# Patient Record
Sex: Male | Born: 1990 | Race: Black or African American | Hispanic: No | Marital: Single | Smoking: Current every day smoker
Health system: Southern US, Community
[De-identification: ages and names within clinical notes are randomized; demographics above are authoritative.]

## PROBLEM LIST (undated history)

## (undated) DIAGNOSIS — S62309A Unspecified fracture of unspecified metacarpal bone, initial encounter for closed fracture: Secondary | ICD-10-CM

## (undated) DIAGNOSIS — K529 Noninfective gastroenteritis and colitis, unspecified: Secondary | ICD-10-CM

## (undated) HISTORY — PX: NASAL FRACTURE SURGERY: SHX718

---
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
## 2011-04-18 ENCOUNTER — Emergency Department (HOSPITAL_BASED_OUTPATIENT_CLINIC_OR_DEPARTMENT_OTHER)
Admission: EM | Admit: 2011-04-18 | Discharge: 2011-04-19 | Disposition: A | Discharge status: Home / Self Care | Payer: Self-pay | Attending: Emergency Medicine | Admitting: Emergency Medicine

## 2011-04-18 ENCOUNTER — Emergency Department (INDEPENDENT_AMBULATORY_CARE_PROVIDER_SITE_OTHER): Payer: Self-pay

## 2011-04-18 ENCOUNTER — Encounter: Payer: Self-pay | Admitting: *Deleted

## 2011-04-18 DIAGNOSIS — S62309A Unspecified fracture of unspecified metacarpal bone, initial encounter for closed fracture: Secondary | ICD-10-CM | POA: Insufficient documentation

## 2011-04-18 DIAGNOSIS — X838XXA Intentional self-harm by other specified means, initial encounter: Secondary | ICD-10-CM | POA: Insufficient documentation

## 2011-04-18 DIAGNOSIS — Y9367 Activity, basketball: Secondary | ICD-10-CM

## 2011-04-18 DIAGNOSIS — S62329A Displaced fracture of shaft of unspecified metacarpal bone, initial encounter for closed fracture: Secondary | ICD-10-CM

## 2011-04-18 DIAGNOSIS — W219XXA Striking against or struck by unspecified sports equipment, initial encounter: Secondary | ICD-10-CM

## 2011-04-18 MED ORDER — HYDROCODONE-ACETAMINOPHEN 5-500 MG PO TABS: 1.0000 | ORAL_TABLET | Freq: Four times a day (QID) | ORAL | Status: AC | PRN

## 2011-04-18 NOTE — ED Provider Notes (Signed)
History     Chief Complaint  Patient presents with  . Hand Injury   Patient is a 20 y.o. male presenting with arm injury. The history is provided by the patient.  Arm Injury  The incident occurred today. The wounds were self-inflicted. There is an injury to the right hand. The pain is moderate. Associated symptoms include numbness and focal weakness. Pertinent negatives include no chest pain, no tingling and no cough. There have been no prior injuries to these areas. He is right-handed.  patient punched a pole while playing basketball and has pain and swelling in his right hand.   He also states that he has a red area on his left upper chest that has been there for a while.  History reviewed. No pertinent past medical history.  History reviewed. No pertinent past surgical history.  No family history on file.  History  Substance Use Topics  . Smoking status: Never Smoker   . Smokeless tobacco: Not on file  . Alcohol Use: No      Review of Systems  Constitutional: Negative for appetite change.  Respiratory: Negative for cough.   Cardiovascular: Negative for chest pain.  Gastrointestinal: Negative for abdominal distention.  Musculoskeletal:       Pain and swelling to right hand.   Skin: Positive for rash.  Neurological: Positive for focal weakness and numbness. Negative for tingling.    Physical Exam  BP 124/75  Pulse 48  Temp(Src) 98 F (36.7 C) (Oral)  Resp 19  Ht 5\' 5"  (1.651 m)  Wt 175 lb (79.379 kg)  BMI 29.12 kg/m2  SpO2 100%  Physical Exam  Constitutional: He appears well-developed.  HENT:  Head: Normocephalic.  Cardiovascular: Normal rate.   Pulmonary/Chest: Effort normal and breath sounds normal.       2cm round slightly raised redness to right   Abdominal: Soft.  Musculoskeletal: Normal range of motion.       Swelling and tenderness over right 4th and 5th metacarpal. Abrasion over knuckle, but skin intact.     ED Course  Procedures  MDM Boxers  fracture. Skin closed. Splinted. Hand followup.       Juliet Rude. Rubin Payor, MD 04/18/11 [phone]

## 2012-08-22 ENCOUNTER — Encounter (HOSPITAL_BASED_OUTPATIENT_CLINIC_OR_DEPARTMENT_OTHER): Payer: Self-pay | Admitting: *Deleted

## 2012-08-22 ENCOUNTER — Emergency Department (HOSPITAL_BASED_OUTPATIENT_CLINIC_OR_DEPARTMENT_OTHER): Payer: Self-pay

## 2012-08-22 ENCOUNTER — Emergency Department (HOSPITAL_BASED_OUTPATIENT_CLINIC_OR_DEPARTMENT_OTHER)
Admission: EM | Admit: 2012-08-22 | Discharge: 2012-08-22 | Disposition: A | Discharge status: Home / Self Care | Payer: Self-pay | Attending: Emergency Medicine | Admitting: Emergency Medicine

## 2012-08-22 DIAGNOSIS — K529 Noninfective gastroenteritis and colitis, unspecified: Secondary | ICD-10-CM

## 2012-08-22 DIAGNOSIS — K5289 Other specified noninfective gastroenteritis and colitis: Secondary | ICD-10-CM | POA: Insufficient documentation

## 2012-08-22 LAB — URINALYSIS, ROUTINE W REFLEX MICROSCOPIC
Bilirubin Urine: NEGATIVE
Specific Gravity, Urine: 1.035 — ABNORMAL HIGH (ref 1.005–1.030)
Urobilinogen, UA: 0.2 mg/dL (ref 0.0–1.0)
pH: 6 (ref 5.0–8.0)

## 2012-08-22 LAB — COMPREHENSIVE METABOLIC PANEL
ALT: 9 U/L (ref 0–53)
Alkaline Phosphatase: 73 U/L (ref 39–117)
BUN: 11 mg/dL (ref 6–23)
CO2: 26 mEq/L (ref 19–32)
GFR calc Af Amer: >90 mL/min (ref >90)
GFR calc non Af Amer: 85 mL/min — ABNORMAL LOW (ref >90)
Glucose, Bld: 101 mg/dL — ABNORMAL HIGH (ref 70–99)
Potassium: 3.4 mEq/L — ABNORMAL LOW (ref 3.5–5.1)
Sodium: 134 mEq/L — ABNORMAL LOW (ref 135–145)
Total Bilirubin: 1.4 mg/dL — ABNORMAL HIGH (ref 0.3–1.2)

## 2012-08-22 LAB — CBC WITH DIFFERENTIAL/PLATELET
Hemoglobin: 11.4 g/dL — ABNORMAL LOW (ref 13.0–17.0)
Lymphocytes Relative: 7 % — ABNORMAL LOW (ref 12–46)
Lymphs Abs: 0.8 10*3/uL (ref 0.7–4.0)
MCV: 88.7 fL (ref 78.0–100.0)
Monocytes Relative: 6 % (ref 3–12)
Neutrophils Relative %: 87 % — ABNORMAL HIGH (ref 43–77)
Platelets: 169 10*3/uL (ref 150–400)
RBC: 3.91 MIL/uL — ABNORMAL LOW (ref 4.22–5.81)
WBC: 11 10*3/uL — ABNORMAL HIGH (ref 4.0–10.5)

## 2012-08-22 MED ORDER — SODIUM CHLORIDE 0.9 % IV BOLUS (SEPSIS)
1000.0000 mL | Freq: Once | INTRAVENOUS | Status: AC
  Administered 2012-08-22: 1000 mL via INTRAVENOUS

## 2012-08-22 MED ORDER — METRONIDAZOLE 500 MG PO TABS: 500.0000 mg | ORAL_TABLET | Freq: Two times a day (BID) | ORAL | Status: DC

## 2012-08-22 MED ORDER — CIPROFLOXACIN HCL 500 MG PO TABS: 500.0000 mg | ORAL_TABLET | Freq: Two times a day (BID) | ORAL | Status: DC

## 2012-08-22 MED ORDER — IOHEXOL 300 MG/ML  SOLN
50.0000 mL | Freq: Once | INTRAMUSCULAR | Status: AC | PRN
  Administered 2012-08-22: 50 mL via ORAL

## 2012-08-22 MED ORDER — ONDANSETRON 8 MG PO TBDP: 8.0000 mg | ORAL_TABLET | Freq: Three times a day (TID) | ORAL | Status: DC | PRN

## 2012-08-22 MED ORDER — OXYCODONE-ACETAMINOPHEN 5-325 MG PO TABS: 1.0000 | ORAL_TABLET | ORAL | Status: DC | PRN

## 2012-08-22 MED ORDER — ONDANSETRON HCL 4 MG/2ML IJ SOLN
4.0000 mg | Freq: Once | INTRAMUSCULAR | Status: AC
  Administered 2012-08-22: 4 mg via INTRAVENOUS
  Filled 2012-08-22: qty 2

## 2012-08-22 MED ORDER — IOHEXOL 300 MG/ML  SOLN
100.0000 mL | Freq: Once | INTRAMUSCULAR | Status: AC | PRN
  Administered 2012-08-22: 100 mL via INTRAVENOUS

## 2012-08-22 MED ORDER — CIPROFLOXACIN IN D5W 400 MG/200ML IV SOLN
400.0000 mg | Freq: Once | INTRAVENOUS | Status: AC
  Administered 2012-08-22: 400 mg via INTRAVENOUS
  Filled 2012-08-22: qty 200

## 2012-08-22 MED ORDER — METRONIDAZOLE 500 MG PO TABS
500.0000 mg | ORAL_TABLET | Freq: Once | ORAL | Status: AC
  Administered 2012-08-22: 500 mg via ORAL
  Filled 2012-08-22: qty 1

## 2012-08-22 MED ORDER — HYDROMORPHONE HCL PF 1 MG/ML IJ SOLN
1.0000 mg | Freq: Once | INTRAMUSCULAR | Status: AC
  Administered 2012-08-22: 1 mg via INTRAVENOUS
  Filled 2012-08-22: qty 1

## 2012-08-22 NOTE — ED Notes (Signed)
Patient called for triage; patient not in waiting room.  Reported that he is the bathroom.

## 2012-08-22 NOTE — ED Provider Notes (Signed)
History     CSN: [id number]  Arrival date & time 08/22/12  1204   First MD Initiated Contact with Patient 08/22/12 1238      Chief Complaint  Patient presents with  . Abdominal Pain    (Consider location/radiation/quality/duration/timing/severity/associated sxs/prior treatment) HPI Patient complaining of diffuse abdominal pain since yesterday. He has had some diarrhea but not very much. He has had crampy diffuse pain. He has had nausea but no vomiting. He has had some fever and chills. He denies any previous similar symptoms. The stool is without blood or pus. He has been urinating the same as usual without any burning with urination or urethral discharge or testicular pain. History reviewed. No pertinent past medical history.  History reviewed. No pertinent past surgical history.  No family history on file.  History  Substance Use Topics  . Smoking status: Never Smoker   . Smokeless tobacco: Not on file  . Alcohol Use: No      Review of Systems  Constitutional: Negative for fever and chills.  HENT: Negative for neck stiffness.   Eyes: Negative for visual disturbance.  Respiratory: Negative for shortness of breath.   Cardiovascular: Negative for chest pain.  Gastrointestinal: Negative for vomiting, diarrhea and blood in stool.  Genitourinary: Negative for dysuria, frequency and decreased urine volume.  Musculoskeletal: Negative for myalgias and joint swelling.  Skin: Negative for rash.  Neurological: Negative for weakness.  Hematological: Negative for adenopathy.  Psychiatric/Behavioral: Negative for agitation.    Allergies  Bee venom and Shellfish allergy  Home Medications  No current outpatient prescriptions on file.  BP 123/72  Pulse 75  Temp 100.4 F (38 C) (Oral)  Resp 18  Ht 5\' 9"  (1.753 m)  Wt 160 lb (72.576 kg)  BMI 23.63 kg/m2  SpO2 99%  Physical Exam  Nursing note and vitals reviewed. Constitutional: He is oriented to person, place, and  time. He appears well-developed and well-nourished.  HENT:  Head: Normocephalic and atraumatic.  Eyes: Conjunctivae normal and EOM are normal. Pupils are equal, round, and reactive to light.  Neck: Normal range of motion. Neck supple.  Cardiovascular: Normal rate and regular rhythm.   Pulmonary/Chest: Effort normal.  Abdominal: Soft. Bowel sounds are normal. There is tenderness.       Mild diffuse tenderness no rebound noted.  Musculoskeletal: Normal range of motion.  Neurological: He is alert and oriented to person, place, and time.  Skin: Skin is warm and dry.  Psychiatric: He has a normal mood and affect. His behavior is normal. Judgment and thought content normal.    ED Course  Procedures (including critical care time)  Labs Reviewed  URINALYSIS, ROUTINE W REFLEX MICROSCOPIC - Abnormal; Notable for the following:    Color, Urine AMBER (*)  BIOCHEMICALS MAY BE AFFECTED BY COLOR   Specific Gravity, Urine 1.035 (*)     Hgb urine dipstick TRACE (*)     Ketones, ur 15 (*)     Protein, ur 100 (*)     All other components within normal limits  CBC WITH DIFFERENTIAL - Abnormal; Notable for the following:    WBC 11.0 (*)     RBC 3.91 (*)     Hemoglobin 11.4 (*)     HCT 34.7 (*)     Neutrophils Relative 87 (*)     Neutro Abs 9.6 (*)     Lymphocytes Relative 7 (*)     All other components within normal limits  COMPREHENSIVE METABOLIC PANEL - Abnormal; Notable  for the following:    Sodium 134 (*)     Potassium 3.4 (*)     Glucose, Bld 101 (*)     Total Bilirubin 1.4 (*)     GFR calc non Af Amer 85 (*)     All other components within normal limits  LIPASE, BLOOD  URINE MICROSCOPIC-ADD ON   Ct Abdomen Pelvis W Contrast  08/22/2012  *RADIOLOGY REPORT*  Clinical Data: Abdominal pain and diarrhea.  CT ABDOMEN AND PELVIS WITH CONTRAST  Technique:  Multidetector CT imaging of the abdomen and pelvis was performed following the standard protocol during bolus administration of  intravenous contrast.  Contrast:  OMNIPAQUE IOHEXOL 300 MG/ML  SOLN  Comparison: None.  Findings: The abdominal parenchymal organs are normal in appearance.  Gallbladder is unremarkable.  No evidence of hydronephrosis.  No soft tissue masses or lymphadenopathy identified within the abdomen or pelvis.  The diffuse colonic wall thickening is seen throughout the colon. There appears to be sparing of the rectum and no definite involvement of the terminal ileum.  There is no evidence of bowel obstruction, abscess, or free fluid.  This is consistent with diffuse colitis peri  IMPRESSION:  1.  Findings consistent with diffuse colitis, most likely infectious or inflammatory in etiology. 2.  No evidence of abscess or other complication.   Original Report Authenticated By: Myles Rosenthal, M.D.      No diagnosis found.    Patient with fever and colitis on CT scan. He is given IV Cipro and by mouth Flagyl here care. He is tolerating fluids without difficulty. He is discharged on Cipro, Flagyl, Percocet, and Zofran. He is advised to return if he is not better in 24 hours or if he is worsening at any time       Hilario Quarry, MD 08/22/12 1450

## 2012-08-22 NOTE — ED Notes (Signed)
Abdominal pain since yesterday. Diarrhea. No appetite.

## 2012-08-22 NOTE — ED Notes (Signed)
Diarrhea no appetite.

## 2013-05-02 ENCOUNTER — Emergency Department (HOSPITAL_COMMUNITY)
Admission: EM | Admit: 2013-05-02 | Discharge: 2013-05-02 | Disposition: A | Discharge status: Home / Self Care | Payer: Self-pay | Attending: Emergency Medicine | Admitting: Emergency Medicine

## 2013-05-02 ENCOUNTER — Emergency Department (HOSPITAL_COMMUNITY): Payer: Self-pay

## 2013-05-02 ENCOUNTER — Encounter (HOSPITAL_COMMUNITY): Payer: Self-pay | Admitting: Emergency Medicine

## 2013-05-02 DIAGNOSIS — K1121 Acute sialoadenitis: Secondary | ICD-10-CM

## 2013-05-02 DIAGNOSIS — K1120 Sialoadenitis, unspecified: Secondary | ICD-10-CM | POA: Insufficient documentation

## 2013-05-02 DIAGNOSIS — Z79899 Other long term (current) drug therapy: Secondary | ICD-10-CM | POA: Insufficient documentation

## 2013-05-02 DIAGNOSIS — Z8719 Personal history of other diseases of the digestive system: Secondary | ICD-10-CM | POA: Insufficient documentation

## 2013-05-02 DIAGNOSIS — Z8781 Personal history of (healed) traumatic fracture: Secondary | ICD-10-CM | POA: Insufficient documentation

## 2013-05-02 HISTORY — DX: Noninfective gastroenteritis and colitis, unspecified: K52.9

## 2013-05-02 HISTORY — DX: Unspecified fracture of unspecified metacarpal bone, initial encounter for closed fracture: S62.309A

## 2013-05-02 MED ORDER — AMOXICILLIN-POT CLAVULANATE 875-125 MG PO TABS: 1.0000 | ORAL_TABLET | Freq: Two times a day (BID) | ORAL | Status: DC

## 2013-05-02 MED ORDER — FENTANYL CITRATE 0.05 MG/ML IJ SOLN
100.0000 ug | Freq: Once | INTRAMUSCULAR | Status: AC
  Administered 2013-05-02: 100 ug via INTRAVENOUS
  Filled 2013-05-02: qty 2

## 2013-05-02 MED ORDER — HYDROCODONE-ACETAMINOPHEN 5-325 MG PO TABS: 1.0000 | ORAL_TABLET | Freq: Four times a day (QID) | ORAL | Status: DC | PRN

## 2013-05-02 MED ORDER — SODIUM CHLORIDE 0.9 % IV SOLN
INTRAVENOUS | Status: DC
  Administered 2013-05-02: 05:00:00 via INTRAVENOUS

## 2013-05-02 MED ORDER — SODIUM CHLORIDE 0.9 % IV SOLN
3.0000 g | Freq: Once | INTRAVENOUS | Status: AC
  Administered 2013-05-02: 3 g via INTRAVENOUS
  Filled 2013-05-02: qty 3

## 2013-05-02 MED ORDER — IOHEXOL 300 MG/ML  SOLN
75.0000 mL | Freq: Once | INTRAMUSCULAR | Status: AC | PRN
  Administered 2013-05-02: 75 mL via INTRAVENOUS

## 2013-05-02 NOTE — ED Provider Notes (Signed)
History    CSN: [id number] Arrival date & time 05/02/13  8295  First MD Initiated Contact with Patient 05/02/13 0424     Chief Complaint  Patient presents with  . Neck Pain   (Consider location/radiation/quality/duration/timing/severity/associated sxs/prior Treatment) HPI This is a 22 year old male with a two-day history of pain in his neck. Specifically the pain is located just inferior and posterior to the right external ear. The pain is moderate to severe, worse with palpation or movement of his head. He is not aware of having any fever. He has a toothache on that side. He has no associated lymphadenopathy. He denies any injury. He states the pain radiates into the right side of his face.  Past Medical History  Diagnosis Date  . Colitis   . Metacarpal bone fracture    History reviewed. No pertinent past surgical history. No family history on file. History  Substance Use Topics  . Smoking status: Never Smoker   . Smokeless tobacco: Not on file  . Alcohol Use: No    Review of Systems  All other systems reviewed and are negative.    Allergies  Bee venom and Shellfish allergy  Home Medications   Current Outpatient Rx  Name  Route  Sig  Dispense  Refill  . ciprofloxacin (CIPRO) 500 MG tablet   Oral   Take 1 tablet (500 mg total) by mouth every 12 (twelve) hours.   10 tablet   0   . metroNIDAZOLE (FLAGYL) 500 MG tablet   Oral   Take 1 tablet (500 mg total) by mouth 2 (two) times daily.   14 tablet   0   . ondansetron (ZOFRAN ODT) 8 MG disintegrating tablet   Oral   Take 1 tablet (8 mg total) by mouth every 8 (eight) hours as needed for nausea.   20 tablet   0   . oxyCODONE-acetaminophen (PERCOCET/ROXICET) 5-325 MG per tablet   Oral   Take 1 tablet by mouth every 4 (four) hours as needed for pain.   12 tablet   0    BP 122/54  Pulse 60  Temp(Src) 97.8 F (36.6 C) (Oral)  Resp 18  SpO2 100%  Physical Exam General: Well-developed, well-nourished  male in no acute distress; appearance consistent with age of record HENT: normocephalic, atraumatic; normal TMs Eyes: pupils equal round and reactive to light; extraocular muscles intact Neck: supple but decreased range of motion due to pain; tenderness and mild swelling of soft tissue of right neck inferior and just posterior to right external ear without palpable lymphadenopathy Heart: regular rate and rhythm Lungs: clear to auscultation bilaterally Abdomen: soft; nondistended Extremities: No deformity; full range of motion Neurologic: Awake, alert and oriented; motor function intact in all extremities and symmetric; no facial droop Skin: Warm and dry Psychiatric: Flat affect    ED Course  Procedures (including critical care time)   MDM  Nursing notes and vitals signs, including pulse oximetry, reviewed.  Summary of this visit's results, reviewed by myself:  Imaging Studies: Ct Soft Tissue Neck W Contrast  05/02/2013   *RADIOLOGY REPORT*  Clinical Data: Neck pain and swelling on the right.  CT NECK WITH CONTRAST  Technique:  Multidetector CT imaging of the neck was performed with intravenous contrast.  Contrast: 75mL OMNIPAQUE IOHEXOL 300 MG/ML  SOLN  Comparison: CT head 11/28/2004  Findings: Visualization of the area of the patient's pain is limited due to streak artifact arising from dental work.  There appears to be some  infiltration in the subcutaneous fat below the right ear which is somewhat asymmetric to the left side.  Changes could represent inflammation or infiltration in the parotid gland. No discrete fluid collection or focal mass lesion is identified. Cervical lymph nodes are not pathologically involved.  Sublingual glands appear mildly prominent.  Tonsils and adenoids appear mildly prominent.  No discrete tonsillar abscess identified.  The cervical carotid and jugular vessels appear patent without displacement. Fat planes are not effaced.  No prevertebral or mucosal space  mass or infiltration is identified.  No focal bone erosion.  Visualized paranasal sinuses are not opacified.  Probable old nasal bone fractures.  Lung apices are clear.  Cervical tracheal airway appears patent.  IMPRESSION: Visualization of the relevant anatomy is limited due to streak artifact from dental work.  There appears to be some fullness in the soft tissues anterior and beneath the right ear, corresponding to the palpable abnormality.  This appears represent enlargement or infiltration of the salivary glands and likely is inflammatory.  No discrete abscess or mass is identified.   Original Report Authenticated By: Burman Nieves, M.D.      Hanley Seamen, MD 05/02/13 [id number]

## 2013-05-02 NOTE — ED Notes (Signed)
The pt is c/o pain in his rt neck just under his rt ear.  He just noticed today

## 2013-05-02 NOTE — ED Notes (Addendum)
PT. TRANSPORTED TO CT . IV NS INFUSING , IV SITE UNREMARKABLE , PT. GIVEN FENTANYL 100 MCG .

## 2013-05-04 LAB — MUMPS ANTIBODY, IGM: Mumps IgM: <1:20 {titer}

## 2014-01-05 ENCOUNTER — Emergency Department (HOSPITAL_COMMUNITY)
Admission: EM | Admit: 2014-01-05 | Discharge: 2014-01-05 | Disposition: A | Discharge status: Home / Self Care | Payer: Self-pay | Attending: Emergency Medicine | Admitting: Emergency Medicine

## 2014-01-05 ENCOUNTER — Encounter (HOSPITAL_COMMUNITY): Payer: Self-pay | Admitting: Emergency Medicine

## 2014-01-05 DIAGNOSIS — K029 Dental caries, unspecified: Secondary | ICD-10-CM | POA: Insufficient documentation

## 2014-01-05 DIAGNOSIS — Z8781 Personal history of (healed) traumatic fracture: Secondary | ICD-10-CM | POA: Insufficient documentation

## 2014-01-05 MED ORDER — OXYCODONE-ACETAMINOPHEN 5-325 MG PO TABS
1.0000 | ORAL_TABLET | Freq: Once | ORAL | Status: AC
  Administered 2014-01-05: 1 via ORAL
  Filled 2014-01-05: qty 1

## 2014-01-05 MED ORDER — PENICILLIN V POTASSIUM 250 MG PO TABS: 250.0000 mg | ORAL_TABLET | Freq: Four times a day (QID) | ORAL | Status: AC

## 2014-01-05 MED ORDER — OXYCODONE-ACETAMINOPHEN 5-325 MG PO TABS: 1.0000 | ORAL_TABLET | Freq: Four times a day (QID) | ORAL | Status: AC | PRN

## 2014-01-05 NOTE — ED Notes (Signed)
Patient has ride home with girlfriend 

## 2014-01-05 NOTE — ED Provider Notes (Signed)
CSN: [id number]     Arrival date & time 01/05/14  0027 History   First MD Initiated Contact with Patient 01/05/14 0251     Chief Complaint  Patient presents with  . Dental Pain     (Consider location/radiation/quality/duration/timing/severity/associated sxs/prior Treatment) Patient is a 23 y.o. male presenting with tooth pain. The history is provided by the patient.  Dental Pain Location:  Upper Upper teeth location:  2/RU 2nd molar and 1/RU 3rd molar Quality:  Localized, sharp, radiating and pulsating Severity:  Severe Onset quality:  Gradual Duration:  3 days Timing:  Constant Progression:  Worsening Chronicity:  New Context: dental caries   Relieved by:  Nothing Worsened by:  Hot food/drink, cold food/drink and touching Ineffective treatments:  Acetaminophen, ice, NSAIDs and topical anesthetic gel Associated symptoms: facial pain   Associated symptoms: no difficulty swallowing, no drooling, no facial swelling, no fever, no gum swelling and no trismus   Risk factors: periodontal disease   Risk factors: no alcohol problem     Past Medical History  Diagnosis Date  . Colitis   . Metacarpal bone fracture    History reviewed. No pertinent past surgical history. No family history on file. History  Substance Use Topics  . Smoking status: Never Smoker   . Smokeless tobacco: Not on file  . Alcohol Use: No    Review of Systems  Constitutional: Negative for fever.  HENT: Negative for drooling and facial swelling.   All other systems reviewed and are negative.      Allergies  Bee venom and Shellfish allergy  Home Medications   Current Outpatient Rx  Name  Route  Sig  Dispense  Refill  . oxyCODONE-acetaminophen (PERCOCET/ROXICET) 5-325 MG per tablet   Oral   Take 1-2 tablets by mouth every 6 (six) hours as needed for severe pain.   15 tablet   0   . penicillin v potassium (VEETID) 250 MG tablet   Oral   Take 1 tablet (250 mg total) by mouth 4 (four) times  daily.   40 tablet   0    BP 138/76  Pulse 75  Temp(Src) 97.7 F (36.5 C) (Oral)  Resp 18  Ht 5\' 8"  (1.727 m)  Wt 161 lb (73.029 kg)  BMI 24.49 kg/m2  SpO2 100% Physical Exam  Nursing note and vitals reviewed. Constitutional: He appears well-developed and well-nourished. No distress.  HENT:  Head: Normocephalic and atraumatic.  Mouth/Throat: No oral lesions. No trismus in the jaw. Dental caries present. No dental abscesses or uvula swelling.    Eyes: EOM are normal. Pupils are equal, round, and reactive to light.  Cardiovascular: Normal rate.   Pulmonary/Chest: Effort normal.  Lymphadenopathy:    He has no cervical adenopathy.  Neurological: He is alert.  Skin: Skin is warm and dry. No rash noted. No erythema.  Psychiatric: He has a normal mood and affect. His behavior is normal.    ED Course  Procedures (including critical care time) Labs Review Labs Reviewed - No data to display Imaging Review No results found.   EKG Interpretation None      MDM   Final diagnoses:  Dental caries    Pt with dental caries and without facial swelling.  No signs of ludwig's angina or difficulty swallowing and no systemic symptoms. Will treat with PCN and have pt f/u with dentist.     Gwyneth SproutWhitney Jevaughn Degollado, MD 01/05/14 [id number]

## 2014-01-05 NOTE — Discharge Instructions (Signed)
Dental Caries °Dental caries is tooth decay. This decay can cause a hole in teeth (cavity) that can get bigger and deeper over time. °HOME CARE °· Brush and floss your teeth. Do this at least two times a day. °· Use a fluoride toothpaste. °· Use a mouth rinse if told by your dentist or doctor. °· Eat less sugary and starchy foods. Drink less sugary drinks. °· Avoid snacking often on sugary and starchy foods. Avoid sipping often on sugary drinks. °· Keep regular checkups and cleanings with your dentist. °· Use fluoride supplements if told by your dentist or doctor. °· Allow fluoride to be applied to teeth if told by your dentist or doctor. °MAKE SURE YOU: °· Understand these instructions. °· Will watch your condition. °· Will get help right away if you are not doing well or get worse. °Document Released: 07/07/2008 Document Revised: 05/31/2013 Document Reviewed: 09/30/2012 °[employer]® Patient Information ©2014 [employer]. ° °Dental Pain °A tooth ache may be caused by cavities (tooth decay). Cavities expose the nerve of the tooth to air and hot or cold temperatures. It may come from an infection or abscess (also called a boil or furuncle) around your tooth. It is also often caused by dental caries (tooth decay). This causes the pain you are having. °DIAGNOSIS  °Your caregiver can diagnose this problem by exam. °TREATMENT  °· If caused by an infection, it may be treated with medications which kill germs (antibiotics) and pain medications as prescribed by your caregiver. Take medications as directed. °· Only take over-the-counter or prescription medicines for pain, discomfort, or fever as directed by your caregiver. °· Whether the tooth ache today is caused by infection or dental disease, you should see your dentist as soon as possible for further care. °SEEK MEDICAL CARE IF: °The exam and treatment you received today has been provided on an emergency basis only. This is not a substitute for complete medical or dental  care. If your problem worsens or new problems (symptoms) appear, and you are unable to meet with your dentist, call or return to this location. °SEEK IMMEDIATE MEDICAL CARE IF:  °· You have a fever. °· You develop redness and swelling of your face, jaw, or neck. °· You are unable to open your mouth. °· You have severe pain uncontrolled by pain medicine. °MAKE SURE YOU:  °· Understand these instructions. °· Will watch your condition. °· Will get help right away if you are not doing well or get worse. °Document Released: 09/28/2005 Document Revised: 12/21/2011 Document Reviewed: 05/16/2008 °[employer]® Patient Information ©2014 [employer]. ° °

## 2014-01-05 NOTE — ED Notes (Signed)
Pt. reports right upper molar pain / cavity for several days worse today .

## 2014-10-31 ENCOUNTER — Emergency Department (HOSPITAL_COMMUNITY)
Admission: EM | Admit: 2014-10-31 | Discharge: 2014-10-31 | Disposition: A | Discharge status: Home / Self Care | Payer: Self-pay | Attending: Emergency Medicine | Admitting: Emergency Medicine

## 2014-10-31 ENCOUNTER — Encounter (HOSPITAL_COMMUNITY): Payer: Self-pay | Admitting: Emergency Medicine

## 2014-10-31 DIAGNOSIS — Z79891 Long term (current) use of opiate analgesic: Secondary | ICD-10-CM | POA: Insufficient documentation

## 2014-10-31 DIAGNOSIS — K029 Dental caries, unspecified: Secondary | ICD-10-CM | POA: Insufficient documentation

## 2014-10-31 DIAGNOSIS — Z8781 Personal history of (healed) traumatic fracture: Secondary | ICD-10-CM | POA: Insufficient documentation

## 2014-10-31 DIAGNOSIS — K0381 Cracked tooth: Secondary | ICD-10-CM | POA: Insufficient documentation

## 2014-10-31 MED ORDER — PENICILLIN V POTASSIUM 500 MG PO TABS: 500.0000 mg | ORAL_TABLET | Freq: Three times a day (TID) | ORAL | Status: AC

## 2014-10-31 MED ORDER — IBUPROFEN 600 MG PO TABS
600.0000 mg | ORAL_TABLET | Freq: Four times a day (QID) | ORAL | Status: AC | PRN
Start: 2014-10-31 — End: ?

## 2014-10-31 MED ORDER — HYDROCODONE-ACETAMINOPHEN 5-325 MG PO TABS: 1.0000 | ORAL_TABLET | ORAL | Status: AC | PRN

## 2014-10-31 NOTE — ED Notes (Signed)
Pt a/o x 4 on d/c with steady gait. 

## 2014-10-31 NOTE — ED Notes (Signed)
Pt c/o bottom right tooth pain x 3 weeks.

## 2014-10-31 NOTE — Discharge Instructions (Signed)
Dental Caries Dental caries is tooth decay. This decay can cause a hole in teeth (cavity) that can get bigger and deeper over time. HOME CARE  Brush and floss your teeth. Do this at least two times a day.  Use a fluoride toothpaste.  Use a mouth rinse if told by your dentist or doctor.  Eat less sugary and starchy foods. Drink less sugary drinks.  Avoid snacking often on sugary and starchy foods. Avoid sipping often on sugary drinks.  Keep regular checkups and cleanings with your dentist.  Use fluoride supplements if told by your dentist or doctor.  Allow fluoride to be applied to teeth if told by your dentist or doctor. Document Released: 07/07/2008 Document Revised: 02/12/2014 Document Reviewed: 09/30/2012 [facility] Patient Information 2015 [place], [place]. This information is not intended to replace advice given to you by your health care provider. Make sure you discuss any questions you have with your health care provider.  Dental Pain Toothache is pain in or around a tooth. It may get worse with chewing or with cold or heat.  HOME CARE  Your dentist may use a numbing medicine during treatment. If so, you may need to avoid eating until the medicine wears off. Ask your dentist about this.  Only take medicine as told by your dentist or doctor.  Avoid chewing food near the painful tooth until after all treatment is done. Ask your dentist about this. GET HELP RIGHT AWAY IF:   The problem gets worse or new problems appear.  You have a fever.  There is redness and puffiness (swelling) of the face, jaw, or neck.  You cannot open your mouth.  There is pain in the jaw.  There is very bad pain that is not helped by medicine. MAKE SURE YOU:   Understand these instructions.  Will watch your condition.  Will get help right away if you are not doing well or get worse. Document Released: 03/16/2008 Document Revised: 12/21/2011 Document Reviewed: 03/16/2008 [facility] Patient  Information 2015 [place], [place]. This information is not intended to replace advice given to you by your health care provider. Make sure you discuss any questions you have with your health care provider.   Emergency Department Resource Guide 1) Find a Doctor and Pay Out of Pocket Although you won't have to find out who is covered by your insurance plan, it is a good idea to ask around and get recommendations. You will then need to call the office and see if the doctor you have chosen will accept you as a new patient and what types of options they offer for patients who are self-pay. Some doctors offer discounts or will set up payment plans for their patients who do not have insurance, but you will need to ask so you aren't surprised when you get to your appointment.  2) Contact Your Local Health Department Not all health departments have doctors that can see patients for sick visits, but many do, so it is worth a call to see if yours does. If you don't know where your local health department is, you can check in your phone book. The CDC also has a tool to help you locate your state's health department, and many state websites also have listings of all of their local health departments.  3) Find a Walk-in Clinic If your illness is not likely to be very severe or complicated, you may want to try a walk in clinic. These are popping up all over the country in  pharmacies, drugstores, and shopping centers. They're usually staffed by nurse practitioners or physician assistants that have been trained to treat common illnesses and complaints. They're usually fairly quick and inexpensive. However, if you have serious medical issues or chronic medical problems, these are probably not your best option. ° °No Primary Care Doctor: °- Call Health Connect at  832-8000 - they can help you locate a primary care doctor that  accepts your insurance, provides certain services, etc. °- Physician Referral Service-  1-800-533-3463 ° °Chronic Pain Problems: °Organization         Address  Phone   Notes  °[facility]  ([phone]) [phone] Patients need to be referred by their primary care doctor.  ° °Medication Assistance: °Organization         Address  Phone   Notes  °[place] [facility] [address] °[place], [place] [address] (336) 641-8030 --Must be a resident of [place] °-- Must have NO insurance coverage whatsoever (no Medicaid/ Medicare, etc.) °-- The pt. MUST have a primary care doctor that directs their care regularly and follows them in the community °  °[facility]  (866) 331-1348   °United Way  (888) 892-1162   ° °Agencies that provide inexpensive medical care: °Organization         Address  Phone   Notes  °[facility]  (336) 832-8035   °[facility]    (336) 832-7272   °[facility] [address] °[place], [place] [address] (336) 832-4777   °[facility] [address], °[place] (336) 271-4999   °Planned Parenthood    (336) 373-0678   °[facility]    (336) 272-1050   °[facility] ° [address], [place] Phone:  (336) 832-4444, Fax:  (336) 832-4440 Hours of Operation:  9 am - 6 pm, M-F.  Also accepts Medicaid/Medicare and self-pay.  °[facility] ° [address], [place] Phone: (336) [phone], Fax: (336) [phone]. Hours of Operation:  8:30 am - 5:30 pm, M-F.  Also accepts Medicaid and self-pay.  °[facility] [place] [address], [place] Phone: (336) [phone]   °[facility] [address], [place], [place] (336)723-1848, Ext. 123 Mondays & Thursdays: 7-9 AM.  First 15 patients are seen on a first come, first serve basis. °  ° °Medicaid-accepting [place] Providers: ° °Organization         Address  Phone   Notes  °[facility] [address],  [place] ([phone]) [phone] Also accepts self-pay patients.  °[facility] [address], [place] ° (336) [phone]   °[facility] [address], [place] (336) [phone]   °[facility] [address] [place] [address], [place] (336) 299-7000   °Veita Bland [address], [place]  ° (336) [phone] Only accepts [place] Access Medicaid patients after they have their name applied to their card.  ° °Self-Pay (no insurance) in [place]: ° °Organization         Address  Phone   Notes  °Sickle Cell Patients, [facility] [address], [place] (336) 832-1970   °[facility] [address],  (336) [phone]   °[facility] ° [address],   [place] [phone]) [phone]   [facility]/Dr. Osei-Bonsu  [address] [place] [address], [place] or [address], [place] [phone] Phone number for both [place] and [place] locations is the same.  [facility] [address], [place] [phone]) [phone]   [facility] [address] [place] [address], [place] or [address] [phone](336) [phone](336) [phone]   [facility] [address], [place] [phone]) [phone], phone; [phone](336) [phone], fax Sees patients 1st and 3rd Saturday of every month.  Must not qualify for public or private insurance (i.e. Medicaid, Medicare, [place] Health Choice, Veterans' Benefits)  Household income should be no more than 200% of the poverty level The clinic cannot treat you if you are pregnant or think you are pregnant  Sexually transmitted diseases are not treated at the clinic.                   Dental Care:                              Organization         Address  Phone  Notes  [facility] [address], [place] [phone]) [phone] Accepts  children up to age 25 who are enrolled in [place] or [place] Health Choice; pregnant women with a Medicaid card; and children who have applied for Medicaid or [place] Health Choice, but were declined, whose parents can pay a reduced fee at time of service.  [facility] [place]  [address], [place] [phone]) [phone] Accepts children up to age 25 who are enrolled in [place] or [place] Health Choice; pregnant women with a Medicaid card; and children who have applied for Medicaid or [place] Health Choice, but were declined, whose parents can pay a reduced fee at time of service.  [facility]  [address], [place] [phone] Patients are seen by appointment only. Walk-ins are not accepted. [facility] will see patients 25 years of age and older. Monday - Tuesday (8am-5pm) Most Wednesdays (8:30-5pm) $30 per visit, cash only  [facility]  [address], [facility] [phone] Patients are seen by appointment only. Walk-ins are not accepted. [facility] will see patients 25 years of age and older. One Wednesday Evening (Monthly: Volunteer Based).  $30 per visit, cash only  [employer] [facility]  [phone] for adults; Children under age 714, call Graduate Pediatric Dentistry at [phone]. Children aged 874-14, please call [phone] to request a pediatric application.  Dental services are provided in all areas of dental care including fillings, crowns and bridges, complete and partial dentures, implants, gum treatment, root canals, and extractions. Preventive care is also provided. Treatment is provided to both adults and children. Patients are selected via a lottery and there is often a waiting list.   [facility] [address], [place]  [phone]) [phone] [email or web address]   [facility] [address], [place], [place] [phone], Ext. [phone] Second and  Fourth Thursday of each month, opens at 6:30 AM; Clinic ends at 9 AM.  Patients are seen on a first-come first-served basis, and a limited number are seen during each clinic.   [facility]  [address], [place], [place] [phone](336) [phone]  Eligibility Requirements You must have lived in [place], [place], or [place] counties for at least the last three months.   You cannot be eligible for state or federal sponsored [employer], including [employer], [place], or [employer].   You generally cannot be eligible for healthcare insurance through your employer.    How to apply: Eligibility screenings are held every Tuesday and Wednesday afternoon from 1:00 pm until 4:00 pm. You do not need an appointment for the interview!  [facility] [address], [place], [place]   [place]  [place] Department  [place]  [phone]    Behavioral Health Resources in the Community: Intensive Outpatient Programs Organization         Address  Phone  Notes  [place] Joseph. [address], [place], [place] [phone]   [facility] [address] [place], [place]   [facility] [address], [place], [place]   [place] [address],  [place], [place] or 801-331-1398   Substance Abuse Resources Organization         Address  Phone  Notes  [facility] Drug Services  229-430-3196   [place]  [phone]   [facility] [place]   Chinita Pester  (562)757-1752   Residential & Outpatient Substance Abuse Program  812-248-4363   Psychological Services Organization         Address  Phone  Notes  [facility] [place]  [place]  336-767-9850   [place]  [address] [place] or 317-841-7391    Mobile Crisis Teams Organization         Address  Phone  Notes  [facility], Mobile Crisis Care Unit  (731)[phone]   [facility]  [address] [place], [place]   [place] Levels [address] [place] [phone]    Self-Help/Support Groups Organization         Address  Phone             Notes  [place]. of [place] - variety of support groups  [place] Call for more information  Narcotics Anonymous (NA), [facility] [address], [employer] [place]  2 meetings at this location   Special educational needs teacher         Address  Phone  Notes  [facility] [place],    [place]  1-(585) [phone]   [facility]  [address] [place] [address]3532, [place], [place]   [place], [place] [phone] Admissions: 8am-3pm M-F  Incentives Substance [facility] [address],    [place], [place] [phone]   [facility] [address] Jadene Pierini [place], [place]   [facility] [address],  [place], [place]   [facility] - Intensive Outpatient [address], [place], [place]   [facility] ([place].,  [place], [place] or [phone]   [facility] ([facility]) [address] [place], [place] Accepts Medicaid  [facility] [address],  [place] 1-8080879374 Substance Abuse/Addiction Treatment   [facility] Resources Organization  Address  Phone  Notes  [facility]  (514) [phone]   Angie Fava, PhD [address] Ervin Knack [place], [place]   (615)083-3424 or (765) [phone]   [facility]   [address] [place], [place] [phone]   [facility] [address], [place], [place] [phone] Insurance/Medicaid/sponsorship through [facility] [address]                                    [place], [place] [phone] Therapy/tele-psych/case  [facility] [address] [place] [address], [place] [phone]    Dr. Lolly Mustache  [phone]   [facility] [place]  [employer] [facility] Dept. 1) [address], [place] 2) [address] [place] 3)  [address], [place] [phone]  [phone]   [facility] Child Abuse Hotline 407-056-5696 or 8655277686 (After Hours)

## 2014-10-31 NOTE — ED Provider Notes (Signed)
CSN: [id number]     Arrival date & time 10/31/14  0550 History   First MD Initiated Contact with Patient 10/31/14 0601     Chief Complaint  Patient presents with  . Dental Pain     (Consider location/radiation/quality/duration/timing/severity/associated sxs/prior Treatment) Patient is a 24 y.o. male presenting with tooth pain. The history is provided by the patient. No language interpreter was used.  Dental Pain Associated symptoms: no congestion, no facial swelling and no fever   Pt is a 24yo male presenting to ED with c/o right lower tooth pain that has gradually worsened over the last 3 weeks but states his right lower molar has been cracked for 3-4 months.  Pain is constant, aching, and sharp, 8/10 at worst. Worse with eating and drinking. Pt has tried ibuprofen and Ambesol w/o relief. States the [place] makes pain even worse. Denies difficulty breathing or swallowing. No facial swelling. Denies fever, n/v/d. Pt is not a smoker. Denies trauma to the mouth.  Pt does not have a dentist. No other significant PMH.  Past Medical History  Diagnosis Date  . Colitis   . Metacarpal bone fracture    History reviewed. No pertinent past surgical history. History reviewed. No pertinent family history. History  Substance Use Topics  . Smoking status: Never Smoker   . Smokeless tobacco: Not on file  . Alcohol Use: No    Review of Systems  Constitutional: Negative for fever and chills.  HENT: Positive for dental problem. Negative for congestion, facial swelling, sore throat, trouble swallowing and voice change.   Respiratory: Negative for cough and shortness of breath.   Gastrointestinal: Negative for nausea and vomiting.  All other systems reviewed and are negative.     Allergies  Bee venom and Shellfish allergy  Home Medications   Prior to Admission medications   Medication Sig Start Date End Date Taking? Authorizing Provider  HYDROcodone-acetaminophen (NORCO/VICODIN) 5-325 MG per  tablet Take 1-2 tablets by mouth every 4 (four) hours as needed for moderate pain or severe pain. 10/31/14   Junius Finner, PA-C  ibuprofen (ADVIL,MOTRIN) 600 MG tablet Take 1 tablet (600 mg total) by mouth every 6 (six) hours as needed. 10/31/14   Junius Finner, PA-C  oxyCODONE-acetaminophen (PERCOCET/ROXICET) 5-325 MG per tablet Take 1-2 tablets by mouth every 6 (six) hours as needed for severe pain. 01/05/14   Gwyneth Sprout, MD  penicillin v potassium (VEETID) 500 MG tablet Take 1 tablet (500 mg total) by mouth 3 (three) times daily. For 10 days 10/31/14 11/07/14  Junius Finner, PA-C   BP 132/85 mmHg  Pulse 71  Temp(Src) 98.2 F (36.8 C) (Oral)  Resp 17  Ht  (1.753 m)  Wt 175 lb (79.379 kg)  BMI 25.83 kg/m2  SpO2 100% Physical Exam  Constitutional: He is oriented to person, place, and time. He appears well-developed and well-nourished.  HENT:  Head: Normocephalic and atraumatic.  Nose: Nose normal.  Mouth/Throat: Uvula is midline, oropharynx is clear and moist and mucous membranes are normal.    Eyes: EOM are normal.  Neck: Normal range of motion.  Cardiovascular: Normal rate.   Pulmonary/Chest: Effort normal.  Musculoskeletal: Normal range of motion.  Neurological: He is alert and oriented to person, place, and time.  Skin: Skin is warm and dry.  Psychiatric: He has a normal mood and affect. His behavior is normal.  Nursing note and vitals reviewed.   ED Course  Procedures (including critical care time) Labs Review Labs Reviewed - No data  to display  Imaging Review No results found.   EKG Interpretation None      MDM   Final diagnoses:  Pain due to dental caries  Dental decay   Pt presenting to ED with c/o gradually worsening dental pain. Pt does have dental disease on exam w/o gingival abscess. Will tx with PCN, norco, and ibuprofen. Home care instructions provided. Advised to f/u with Dr. Leanord AsalFarless, DDS, or to use community resource guide provided to f/u  with a dentist. Return precautions provided. Pt verbalized understanding and agreement with tx plan.    Junius Finnerrin O'Malley, PA-C 10/31/14 [id number]  Purvis SheffieldForrest Harrison, MD 10/31/14 [phone]

## 2015-02-27 ENCOUNTER — Emergency Department (HOSPITAL_COMMUNITY): Payer: Self-pay

## 2015-02-27 ENCOUNTER — Encounter (HOSPITAL_COMMUNITY): Payer: Self-pay | Admitting: *Deleted

## 2015-02-27 ENCOUNTER — Inpatient Hospital Stay (HOSPITAL_COMMUNITY)
Admission: EM | Admit: 2015-02-27 | Discharge: 2015-02-28 | DRG: 316 | Discharge status: Against Medical Advice | Payer: Self-pay | Attending: Cardiology | Admitting: Cardiology

## 2015-02-27 DIAGNOSIS — I309 Acute pericarditis, unspecified: Principal | ICD-10-CM | POA: Diagnosis present

## 2015-02-27 DIAGNOSIS — I319 Disease of pericardium, unspecified: Secondary | ICD-10-CM

## 2015-02-27 LAB — CBC WITH DIFFERENTIAL/PLATELET
Basophils Absolute: 0 10*3/uL (ref 0.0–0.1)
Basophils Relative: 0 % (ref 0–1)
Eosinophils Absolute: 0.1 10*3/uL (ref 0.0–0.7)
Eosinophils Relative: 1 % (ref 0–5)
HEMATOCRIT: 35.1 % — AB (ref 39.0–52.0)
Hemoglobin: 11.4 g/dL — ABNORMAL LOW (ref 13.0–17.0)
LYMPHS PCT: 15 % (ref 12–46)
Lymphs Abs: 1 10*3/uL (ref 0.7–4.0)
MCH: 28.6 pg (ref 26.0–34.0)
MCHC: 32.5 g/dL (ref 30.0–36.0)
MCV: 88 fL (ref 78.0–100.0)
MONOS PCT: 7 % (ref 3–12)
Monocytes Absolute: 0.4 10*3/uL (ref 0.1–1.0)
NEUTROS ABS: 5 10*3/uL (ref 1.7–7.7)
Neutrophils Relative %: 77 % (ref 43–77)
Platelets: 189 10*3/uL (ref 150–400)
RBC: 3.99 MIL/uL — AB (ref 4.22–5.81)
RDW: 12.6 % (ref 11.5–15.5)
WBC: 6.6 10*3/uL (ref 4.0–10.5)

## 2015-02-27 LAB — BASIC METABOLIC PANEL
ANION GAP: 10 (ref 5–15)
BUN: 7 mg/dL (ref 6–20)
CHLORIDE: 100 mmol/L — AB (ref 101–111)
CO2: 26 mmol/L (ref 22–32)
Calcium: 9.4 mg/dL (ref 8.9–10.3)
Creatinine, Ser: 1.04 mg/dL (ref 0.61–1.24)
GFR calc Af Amer: >60 mL/min (ref >60)
GFR calc non Af Amer: >60 mL/min (ref >60)
Glucose, Bld: 134 mg/dL — ABNORMAL HIGH (ref 65–99)
Potassium: 3.3 mmol/L — ABNORMAL LOW (ref 3.5–5.1)
SODIUM: 136 mmol/L (ref 135–145)

## 2015-02-27 LAB — I-STAT TROPONIN, ED: Troponin i, poc: 5.04 ng/mL (ref 0.00–0.08)

## 2015-02-27 MED ORDER — ASPIRIN 325 MG PO TABS
325.0000 mg | ORAL_TABLET | ORAL | Status: AC
  Administered 2015-02-27: 325 mg via ORAL
  Filled 2015-02-27: qty 1

## 2015-02-27 MED ORDER — HYDROCODONE-ACETAMINOPHEN 5-325 MG PO TABS
1.0000 | ORAL_TABLET | Freq: Once | ORAL | Status: AC
  Administered 2015-02-27: 1 via ORAL
  Filled 2015-02-27: qty 1

## 2015-02-27 MED ORDER — NITROGLYCERIN 0.4 MG SL SUBL
0.4000 mg | SUBLINGUAL_TABLET | SUBLINGUAL | Status: DC | PRN
  Administered 2015-02-27 (×2): 0.4 mg via SUBLINGUAL
  Filled 2015-02-27: qty 1

## 2015-02-27 MED ORDER — MORPHINE SULFATE 4 MG/ML IJ SOLN
4.0000 mg | Freq: Once | INTRAMUSCULAR | Status: AC
Start: 2015-02-27 — End: 2015-02-27
  Administered 2015-02-27: 4 mg via INTRAVENOUS
  Filled 2015-02-27: qty 1

## 2015-02-27 NOTE — ED Notes (Signed)
Dr. Jacubowitz at bedside 

## 2015-02-27 NOTE — ED Provider Notes (Signed)
CSN: [id number]     Arrival date & time 02/27/15  1950 History   First MD Initiated Contact with Patient 02/27/15 2148     Chief Complaint  Patient presents with  . Chest Pain     (Consider location/radiation/quality/duration/timing/severity/associated sxs/prior Treatment) Patient is a 24 y.o. male presenting with chest pain. The history is provided by the patient.  Chest Pain Pain location:  L chest Pain quality: sharp   Pain radiates to:  Does not radiate Pain radiates to the back: no   Pain severity:  Severe Onset quality:  Gradual Duration:  1 day Timing:  Constant Progression:  Worsening Chronicity:  New Context: no trauma   Worsened by:  Movement and certain positions Ineffective treatments:  None tried Associated symptoms: no abdominal pain, no back pain, no claudication, no cough, no diaphoresis, no dizziness, no fatigue, no fever, no headache, no lower extremity edema, no nausea, no near-syncope, no orthopnea, no palpitations, no shortness of breath, no syncope and not vomiting   Risk factors: no coronary artery disease, no hypertension and no prior DVT/PE     Past Medical History  Diagnosis Date  . Colitis   . Metacarpal bone fracture    History reviewed. No pertinent past surgical history. No family history on file. History  Substance Use Topics  . Smoking status: Never Smoker   . Smokeless tobacco: Not on file  . Alcohol Use: No    Review of Systems  Constitutional: Positive for chills. Negative for fever, diaphoresis, appetite change and fatigue.  HENT: Negative for congestion, rhinorrhea and sore throat.   Respiratory: Negative for cough, chest tightness, shortness of breath and wheezing.   Cardiovascular: Positive for chest pain. Negative for palpitations, orthopnea, claudication, leg swelling, syncope and near-syncope.  Gastrointestinal: Negative for nausea, vomiting, abdominal pain, diarrhea and abdominal distention.  Musculoskeletal: Negative for  myalgias, back pain, arthralgias, neck pain and neck stiffness.  Skin: Negative for color change, pallor and rash.  Neurological: Negative for dizziness, syncope, light-headedness and headaches.  All other systems reviewed and are negative.     Allergies  Bee venom and Shellfish allergy  Home Medications   Prior to Admission medications   Medication Sig Start Date End Date Taking? Authorizing Provider  HYDROcodone-acetaminophen (NORCO/VICODIN) 5-325 MG per tablet Take 1-2 tablets by mouth every 4 (four) hours as needed for moderate pain or severe pain. Patient not taking: Reported on 02/27/2015 10/31/14   Junius FinnerErin O'Malley, PA-C  ibuprofen (ADVIL,MOTRIN) 600 MG tablet Take 1 tablet (600 mg total) by mouth every 6 (six) hours as needed. Patient not taking: Reported on 02/27/2015 10/31/14   Junius FinnerErin O'Malley, PA-C  oxyCODONE-acetaminophen (PERCOCET/ROXICET) 5-325 MG per tablet Take 1-2 tablets by mouth every 6 (six) hours as needed for severe pain. Patient not taking: Reported on 02/27/2015 01/05/14   Gwyneth SproutWhitney Plunkett, MD   BP 133/74 mmHg  Pulse 74  Temp(Src) 99.5 F (37.5 C) (Oral)  Resp 19  Ht 5\' 9"  (1.753 m)  Wt 146 lb 6.2 oz (66.4 kg)  BMI 21.61 kg/m2  SpO2 100% Physical Exam  Constitutional: He is oriented to person, place, and time. He appears well-developed and well-nourished. No distress.  HENT:  Head: Normocephalic and atraumatic.  Mouth/Throat: Oropharynx is clear and moist.  Eyes: Conjunctivae and EOM are normal. Pupils are equal, round, and reactive to light.  Neck: Normal range of motion. Neck supple. No JVD present.  Cardiovascular: Normal rate, regular rhythm and intact distal pulses.  Exam reveals friction rub. Exam  reveals no gallop.   No murmur heard. Pulmonary/Chest: Effort normal and breath sounds normal. No respiratory distress. He has no wheezes. He has no rales. He exhibits no tenderness.  Abdominal: Soft. Bowel sounds are normal. He exhibits no distension. There is no  tenderness. There is no rebound and no guarding.  Musculoskeletal: Normal range of motion. He exhibits no edema or tenderness.  Neurological: He is alert and oriented to person, place, and time. GCS eye subscore is 4. GCS verbal subscore is 5. GCS motor subscore is 6.  Skin: Skin is warm and dry. No rash noted. He is not diaphoretic. No erythema. No pallor.  Nursing note and vitals reviewed.   ED Course  Procedures (including critical care time) Labs Review Labs Reviewed  CBC WITH DIFFERENTIAL/PLATELET - Abnormal; Notable for the following:    RBC 3.99 (*)    Hemoglobin 11.4 (*)    HCT 35.1 (*)    All other components within normal limits  BASIC METABOLIC PANEL - Abnormal; Notable for the following:    Potassium 3.3 (*)    Chloride 100 (*)    Glucose, Bld 134 (*)    All other components within normal limits  SEDIMENTATION RATE - Abnormal; Notable for the following:    Sed Rate 45 (*)    All other components within normal limits  C-REACTIVE PROTEIN - Abnormal; Notable for the following:    CRP 8.5 (*)    All other components within normal limits  TSH - Abnormal; Notable for the following:    TSH 4.841 (*)    All other components within normal limits  TROPONIN I - Abnormal; Notable for the following:    Troponin I 7.39 (*)    All other components within normal limits  TROPONIN I - Abnormal; Notable for the following:    Troponin I 6.33 (*)    All other components within normal limits  CBC - Abnormal; Notable for the following:    RBC 3.72 (*)    Hemoglobin 10.7 (*)    HCT 32.7 (*)    All other components within normal limits  SEDIMENTATION RATE - Abnormal; Notable for the following:    Sed Rate 55 (*)    All other components within normal limits  C-REACTIVE PROTEIN - Abnormal; Notable for the following:    CRP 8.3 (*)    All other components within normal limits  I-STAT TROPOININ, ED - Abnormal; Notable for the following:    Troponin i, poc 5.04 (*)    All other  components within normal limits  CULTURE, BLOOD (ROUTINE X 2)  CULTURE, BLOOD (ROUTINE X 2)  MRSA PCR SCREENING  BRAIN NATRIURETIC PEPTIDE  HIV ANTIBODY (ROUTINE TESTING)  TSH  BASIC METABOLIC PANEL  T4, FREE  T3, FREE    Imaging Review No results found.   EKG Interpretation   Date/Time:  Wednesday Feb 27 2015 19:56:42 EDT Ventricular Rate:  97 PR Interval:  170 QRS Duration: 84 QT Interval:  332 QTC Calculation: 421 R Axis:   58 Text Interpretation:  Normal sinus rhythm Acute pericarditis Abnormal ECG  No old tracing to compare Confirmed by [facility]  MD, ELLIOTT [phone]) on  02/27/2015 8:43:59 PM      MDM   Final diagnoses:  Pericarditis    24 yo M with no significant PMH presenting with chest pain.  Onset yesterday of severe, pleuritic chest pain.  Worse with movement and laying flat.  Mild SOB mainly due to pain with inspiration.  EKG on presentation shows diffuse PR depressions and ST elevations consistent with pericarditis.  Troponin elevated to 5- possible myocarditis.  Borderline temp at 100.2.  Pt denies recent fevers, URI sx, GI symptoms, was feeling well until yesterday.  No sick contacts.    Exam with S1 and split S2 with rub auscultated.  Lungs CTAB.  Abdomen benign.  No LE edema or s/sx of DVT.  No other acute findings.  Cardiology consulted for pericarditis with elevated troponin. Normal cardiac silhouette on CXR, with normal BP, no JVD, not consistent with pericardial effusion or tamponade.  Cardiology to admit for further management.  Stable during my care.  Discussed with attending Dr. Ethelda ChickJacubowitz.    Jodean LimaEmily Michael Ventresca, MD 03/02/15 1517  Doug SouSam Jacubowitz, MD 03/04/15 [phone]

## 2015-02-27 NOTE — ED Notes (Signed)
Dr. Ethelda ChickJacubowitz asks that 3rd nitro be held at this time.

## 2015-02-27 NOTE — ED Notes (Signed)
Pt in waiting room with a troponin of 5.04  Dr. Effie ShyWentz notified in Pod A, Danielle-RN notified at nurse first, [facility] also [facility]

## 2015-02-27 NOTE — ED Provider Notes (Signed)
Range of anterior chest pain worse with changing positions onset 2 days ago. No shortness of breath. No other associated symptoms. On exam alert Glasgow Coma Score 15 not ill-appearing. Lungs clear to auscultation heart regular rate and rhythm, with split S2 and rub. EKG suggestive of pericarditis.  Doug SouSam Millisa Giarrusso, MD 02/27/15 2239

## 2015-02-28 ENCOUNTER — Inpatient Hospital Stay (HOSPITAL_COMMUNITY): Payer: Self-pay

## 2015-02-28 DIAGNOSIS — I300 Acute nonspecific idiopathic pericarditis: Secondary | ICD-10-CM

## 2015-02-28 DIAGNOSIS — I309 Acute pericarditis, unspecified: Principal | ICD-10-CM | POA: Diagnosis present

## 2015-02-28 LAB — CBC
HEMATOCRIT: 32.7 % — AB (ref 39.0–52.0)
Hemoglobin: 10.7 g/dL — ABNORMAL LOW (ref 13.0–17.0)
MCH: 28.8 pg (ref 26.0–34.0)
MCHC: 32.7 g/dL (ref 30.0–36.0)
MCV: 87.9 fL (ref 78.0–100.0)
PLATELETS: 195 10*3/uL (ref 150–400)
RBC: 3.72 MIL/uL — AB (ref 4.22–5.81)
RDW: 12.6 % (ref 11.5–15.5)
WBC: 5.5 10*3/uL (ref 4.0–10.5)

## 2015-02-28 LAB — BASIC METABOLIC PANEL
Anion gap: 6 (ref 5–15)
BUN: 7 mg/dL (ref 6–20)
CO2: 28 mmol/L (ref 22–32)
Calcium: 8.9 mg/dL (ref 8.9–10.3)
Chloride: 104 mmol/L (ref 101–111)
Creatinine, Ser: 0.89 mg/dL (ref 0.61–1.24)
Glucose, Bld: 87 mg/dL (ref 65–99)
POTASSIUM: 4 mmol/L (ref 3.5–5.1)
SODIUM: 138 mmol/L (ref 135–145)

## 2015-02-28 LAB — BRAIN NATRIURETIC PEPTIDE: B NATRIURETIC PEPTIDE 5: 28.4 pg/mL (ref 0.0–100.0)

## 2015-02-28 LAB — TSH
TSH: 4.841 u[IU]/mL — AB (ref 0.350–4.500)
TSH: 4.048 u[IU]/mL (ref 0.350–4.500)

## 2015-02-28 LAB — TROPONIN I
Troponin I: 7.39 ng/mL (ref <0.031)
Troponin I: 6.33 ng/mL (ref <0.031)

## 2015-02-28 LAB — T4, FREE: FREE T4: 1.04 ng/dL (ref 0.61–1.12)

## 2015-02-28 LAB — MRSA PCR SCREENING: MRSA by PCR: NEGATIVE

## 2015-02-28 LAB — HIV ANTIBODY (ROUTINE TESTING W REFLEX): HIV Screen 4th Generation wRfx: NONREACTIVE

## 2015-02-28 LAB — C-REACTIVE PROTEIN
CRP: 8.5 mg/dL — ABNORMAL HIGH (ref <1.0)
CRP: 8.3 mg/dL — ABNORMAL HIGH (ref <1.0)

## 2015-02-28 LAB — SEDIMENTATION RATE
Sed Rate: 45 mm/hr — ABNORMAL HIGH (ref 0–16)
Sed Rate: 55 mm/hr — ABNORMAL HIGH (ref 0–16)

## 2015-02-28 MED ORDER — POTASSIUM CHLORIDE CRYS ER 20 MEQ PO TBCR
40.0000 meq | EXTENDED_RELEASE_TABLET | Freq: Once | ORAL | Status: AC
  Administered 2015-02-28: 40 meq via ORAL
  Filled 2015-02-28: qty 2

## 2015-02-28 MED ORDER — ONDANSETRON HCL 4 MG/2ML IJ SOLN
4.0000 mg | Freq: Four times a day (QID) | INTRAMUSCULAR | Status: DC | PRN
Start: 2015-02-28 — End: 2015-02-28

## 2015-02-28 MED ORDER — ASPIRIN EC 325 MG PO TBEC: 650.0000 mg | DELAYED_RELEASE_TABLET | Freq: Once | ORAL | Status: DC

## 2015-02-28 MED ORDER — ACETAMINOPHEN 325 MG PO TABS: 650.0000 mg | ORAL_TABLET | ORAL | Status: DC | PRN

## 2015-02-28 MED ORDER — ASPIRIN 325 MG PO TABS
650.0000 mg | ORAL_TABLET | Freq: Three times a day (TID) | ORAL | Status: DC
  Administered 2015-02-28: 650 mg via ORAL
  Filled 2015-02-28 (×2): qty 2

## 2015-02-28 MED ORDER — COLCHICINE 0.6 MG PO TABS
0.6000 mg | ORAL_TABLET | Freq: Two times a day (BID) | ORAL | Status: DC
  Administered 2015-02-28: 0.6 mg via ORAL
  Filled 2015-02-28 (×3): qty 1

## 2015-02-28 MED ORDER — COLCHICINE 0.6 MG PO TABS
0.6000 mg | ORAL_TABLET | Freq: Two times a day (BID) | ORAL | Status: DC
  Administered 2015-02-28: 0.6 mg via ORAL
  Filled 2015-02-28 (×2): qty 1

## 2015-02-28 MED ORDER — SODIUM CHLORIDE 0.9 % IV SOLN: 250.0000 mL | INTRAVENOUS | Status: DC | PRN

## 2015-02-28 MED ORDER — NITROGLYCERIN 0.4 MG SL SUBL: 0.4000 mg | SUBLINGUAL_TABLET | SUBLINGUAL | Status: DC | PRN

## 2015-02-28 MED ORDER — SODIUM CHLORIDE 0.9 % IJ SOLN
3.0000 mL | Freq: Two times a day (BID) | INTRAMUSCULAR | Status: DC
  Administered 2015-02-28: 3 mL via INTRAVENOUS

## 2015-02-28 MED ORDER — ASPIRIN 325 MG PO TABS
325.0000 mg | ORAL_TABLET | ORAL | Status: AC
  Administered 2015-02-28: 325 mg via ORAL
  Filled 2015-02-28: qty 1

## 2015-02-28 MED ORDER — GADOBENATE DIMEGLUMINE 529 MG/ML IV SOLN
25.0000 mL | Freq: Once | INTRAVENOUS | Status: AC
  Administered 2015-02-28: 24 mL via INTRAVENOUS

## 2015-02-28 MED ORDER — SODIUM CHLORIDE 0.9 % IJ SOLN
3.0000 mL | INTRAMUSCULAR | Status: DC | PRN
Start: 2015-02-28 — End: 2015-02-28

## 2015-02-28 NOTE — ED Notes (Signed)
Attempted to call report

## 2015-02-28 NOTE — Progress Notes (Signed)
Cardiology Progress Note  . aspirin  650 mg Oral 3 times per day  . colchicine  0.6 mg Oral BID  . sodium chloride  3 mL Intravenous Q12H    Subjective: No acute events overnight. Mr. Lawrence Joseph was seen and examined this morning.  He says CP has resolved but he still feels as if something "is rubbing."  He denies dyspnea.   Objective: Vital signs in last 24 hours: Filed Vitals:   02/28/15 0530 02/28/15 0545 02/28/15 0600 02/28/15 0630  BP: 125/57 123/59 117/72 118/59  Pulse: 74 67 75 71  Temp:      TempSrc:      Resp: 22 27 24 19   Height:      Weight:      SpO2: 99% 99% 100% 99%   Weight change:   Intake/Output Summary (Last 24 hours) at 02/28/15 0726 Last data filed at 02/28/15 0600  Gross per 24 hour  Intake    480 ml  Output      0 ml  Net    480 ml   General: resting in bed in NAD HEENT: [place]/AT, no carotid bruits or JVD Cardiac: RRR, no rubs, murmurs or gallops; distal pulses intact Pulm: clear to auscultation bilaterally, no wheezes/rales/rhonchi, moving normal volumes of air Abd: soft, nontender, nondistended, BS present Ext: warm and well perfused, no pedal edema Neuro: alert and oriented X3, responding appropriately, moving extremities spontaneously  Telemetry:  NSR 70s  Lab Results: Basic Metabolic Panel:  Recent Labs Lab 02/27/15 2008  NA 136  K 3.3*  CL 100*  CO2 26  GLUCOSE 134*  BUN 7  CREATININE 1.04  CALCIUM 9.4   CBC:  Recent Labs Lab 02/27/15 2008  WBC 6.6  NEUTROABS 5.0  HGB 11.4*  HCT 35.1*  MCV 88.0  PLT 189   Cardiac Enzymes:  Recent Labs Lab 02/28/15 0645  TROPONINI 7.39*   Thyroid Function Tests:  Recent Labs Lab 02/28/15 0645  TSH 4.048   Studies/Results: Dg Chest 2 View  02/27/2015   CLINICAL DATA:  Chest pain for 2 days.  EXAM: CHEST  2 VIEW  COMPARISON:  None.  FINDINGS: Heart size and mediastinal contours are within normal limits. Both lungs are clear. Visualized skeletal structures are unremarkable.   IMPRESSION: Normal exam.   Electronically Signed   By: Inge Rise M.D.   On: 02/27/2015 21:13   Medications: I have reviewed the patient's current medications. Scheduled Meds: . aspirin  650 mg Oral 3 times per day  . colchicine  0.6 mg Oral BID  . sodium chloride  3 mL Intravenous Q12H   Continuous Infusions: none PRN Meds:.sodium chloride, acetaminophen, nitroGLYCERIN, ondansetron (ZOFRAN) IV, sodium chloride   Assessment/Plan: 24 year old previously healthy male with myopericarditis.  Acute pericarditis:  Pleuritic chest pain and dyspnea x 2 days PTA, diffuse ST elevations and PR depression on EKG and elevated troponin suggestive of myopericarditis.  ESR and CRP also elevated.  He denies recent URI symptoms but he felt chills at home.  Symptoms improving on colchicine, ASA.  He denies medical problems, EtOH or drug use.  He is adopted and family hx unknown.  Potential etiologies - infectious (most commonly viral), autoimmune, idiopathic.   - continue ASA 650mg  TID (for 1-2 weeks; can decrease dose after week 1 if symptom-free and CRP normal)  - continue colchicine 0.6mg  BID for 3 months - awaiting 2D ECHO, cardiac MRI - trending troponin; HIV, ANA, blood cultures pending   LOS: 0  days   Francesca Oman, DO IMTS PGY2 on CCU Service 02/28/2015, 23:30 AM  24 year old previously healthy male with significant myopericarditis - troponin 7.3 --> 6.4. Echo and Cardiac MRI pending, started on Colchicine.   Dorothy Spark 02/28/2015

## 2015-02-28 NOTE — Progress Notes (Signed)
Echocardiogram 2D Echocardiogram has been performed.  Lawrence Joseph, Lawrence Joseph 02/28/2015, 1:15 PM

## 2015-02-28 NOTE — Care Management Note (Signed)
Case Management Note  Patient Details  Name: Lawrence Joseph MRN: [id number] Date of Birth: May 19, 1991  Subjective/Objective:    Adm w pericarditis                Action/Plan: lives w fam   Expected Discharge Date:                  Expected Discharge Plan:  Home/Self Care  In-House Referral:     Discharge planning Services     Post Acute Care Choice:    Choice offered to:     DME Arranged:    DME Agency:     HH Arranged:    HH Agency:     Status of Service:     Medicare Important Message Given:    Date Medicare IM Given:    Medicare IM give by:    Date Additional Medicare IM Given:    Additional Medicare Important Message give by:     If discussed at Long Length of Stay Meetings, dates discussed:    Additional Comments: ur review done.  Hanley Haysowell, Lawrence Oetken T, RN 02/28/2015, 9:14 AM

## 2015-02-28 NOTE — H&P (Signed)
Physician History and Physical    Lawrence Joseph MRN: [id number] DOB/AGE: 1990-10-23 24 y.o. Admit date: 02/27/2015  Primary Cardiologist: New  HPI: 24 yo with no significant past history came to the ER with chest pain.  He had some abdominal discomfort about 2 days ago.  Later that day, he developed sharp, pleuritic chest pain.  This was also worse when lying down and better when sitting up.  No congestion or cold-like symptoms.  No cough.  He has been mildly short of breath for the last two days because he feels like he cannot take a deep breath without pain. Temperature was 100.2 in the ER and he has felt warm at home.  ECG showed diffuse ST elevation and PR depression.  He feels better with less chest pain after morphine and vicodin.  He does not know his family history (adopted).  He does not smoke cigarettes.  Troponin was 5.   Review of systems complete and found to be negative unless listed above   PMH: No significant past history.   FH: Patient was adopted and does not know family history.  History   Social History  . Marital Status: Single    Spouse Name: N/A  . Number of Children: N/A  . Years of Education: N/A   Occupational History  . Not on file.   Social History Main Topics  . Smoking status: Never Smoker   . Smokeless tobacco: Not on file  . Alcohol Use: No  . Drug Use: Yes  . Sexual Activity: Not on file   Other Topics Concern  . Not on file   Social History Narrative     Physical Exam: Blood pressure 110/45, pulse 74, temperature 100.2 F (37.9 C), temperature source Oral, resp. rate 27, height _0  (1.753 m), weight 154 lb 14.4 oz (70.262 kg), SpO2 100 %.  General: NAD Neck: No JVD, no thyromegaly or thyroid nodule.  Lungs: Clear to auscultation bilaterally with normal respiratory effort. CV: Nondisplaced PMI.  Heart regular S1/S2, no S3/S4, no murmur.  No peripheral edema.  No carotid bruit.  Normal pedal pulses.  Abdomen: Soft, nontender, no  hepatosplenomegaly, no distention.  Skin: Intact without lesions or rashes.  Neurologic: Alert and oriented x 3.  Psych: Normal affect. Extremities: No clubbing or cyanosis.  HEENT: Normal.   Labs:   Lab Results  Component Value Date   WBC 6.6 02/27/2015   HGB 11.4* 02/27/2015   HCT 35.1* 02/27/2015   MCV 88.0 02/27/2015   PLT 189 02/27/2015    Recent Labs Lab 02/27/15 2008  NA 136  K 3.3*  CL 100*  CO2 26  BUN 7  CREATININE 1.04  CALCIUM 9.4  GLUCOSE 134*  TnI 5.04  Radiology: - CXR: Clear  EKG: NSR, diffuse ST elevation and PR depression with PR elevation in AVR.    ASSESSMENT AND PLAN: 24 yo with no significant past history came to the ER with chest pain.  ECG and symptoms (pleuritic, positional chest pain) suggest acute myopericarditis given elevation in troponin.  Possible viral myopericarditis given fever, GI prodrome.  I do not think that the chest pain represents ACS or pulmonary embolus.  Symptoms are improved with pain meds.   - ASA 650 mg every 8 hours - Colchicine 0.6 mg bid - Cycle troponin to peak and obtain echocardiogram in the morning.  - Will arrange for cardiac MRI to look for evidence of myocarditis.  - Check ANA, HIV, ESR, CRP, TSH -  Blood cultures given fever.   Signed: Loralie Champagne 02/28/2015, 1:06 AM Co-Sign MD

## 2015-02-28 NOTE — Progress Notes (Signed)
Patient released AMA w/girlfriend who also encouraged patient to stay.  Papers signed by patient.  Drs. Herbie BaltimoreHarding and Delton SeeNelson addressed patient per nurse request.  Patient was informed of risks and benefits of AMA and still patient wanted to be "dismissed". Telemetry and PIV d/c'd per protocol.  All concerns and questions addressed. Patient awake, alert and oriented at time of AMA.

## 2015-02-28 NOTE — Progress Notes (Signed)
Placed pt assist for for cholchicine on shadow chart for md to sign. Placed match letter on shadow chart for 34 day of med. Left pt inform on [facility] at pt's bedside.

## 2015-03-01 DIAGNOSIS — I309 Acute pericarditis, unspecified: Secondary | ICD-10-CM

## 2015-03-01 LAB — T3, FREE: T3 FREE: 4.1 pg/mL (ref 2.0–4.4)

## 2015-03-05 ENCOUNTER — Telehealth: Payer: Self-pay | Admitting: *Deleted

## 2015-03-05 MED ORDER — COLCHICINE 0.6 MG PO TABS: 0.6000 mg | ORAL_TABLET | Freq: Two times a day (BID) | ORAL | Status: AC

## 2015-03-05 NOTE — Telephone Encounter (Signed)
Contacted the pt to inform him that per Dr Delton SeeNelson, based on their conversation at the hospital, the pt should start taking colchicine 0.6 mg po bid with 2 refills and come in to our office for a follow-up est post hospital appt in 2-3 weeks, for pericarditis, myocarditis.  Confirmed the pharmacy of choice with the pt. Informed the pt of our address/location of our office.  Scheduled the pt for a follow-up appt with Dr Delton SeeNelson for March 27, 2015 at 0900, informed to arrive 15 minutes early.  Pt verbalized understanding, agrees with this plan, and very gracious for all the assistance provided.

## 2015-03-05 NOTE — Telephone Encounter (Signed)
-----   Message from Lars MassonKatarina H Nelson, MD sent at 02/28/2015  7:44 PM EDT ----- Lajoyce CornersIvy, Could you please call for colchicine 0.6 mg po BID for this patient with 2 refills and have him follow in our clinic in the next 2 weeks? Thank you, KN

## 2015-03-06 LAB — CULTURE, BLOOD (ROUTINE X 2)
CULTURE: NO GROWTH
Culture: NO GROWTH

## 2015-03-21 ENCOUNTER — Encounter: Payer: Self-pay | Admitting: *Deleted

## 2015-03-27 ENCOUNTER — Encounter: Payer: Self-pay | Admitting: *Deleted

## 2015-03-27 ENCOUNTER — Ambulatory Visit: Payer: Self-pay | Admitting: Cardiology

## 2015-04-01 ENCOUNTER — Encounter: Payer: Self-pay | Admitting: Cardiology

## 2018-05-25 ENCOUNTER — Other Ambulatory Visit: Payer: Self-pay

## 2018-05-25 ENCOUNTER — Emergency Department (HOSPITAL_COMMUNITY)
Admission: EM | Admit: 2018-05-25 | Discharge: 2018-05-25 | Disposition: A | Discharge status: Home / Self Care | Payer: Self-pay | Attending: Emergency Medicine | Admitting: Emergency Medicine

## 2018-05-25 DIAGNOSIS — Z79899 Other long term (current) drug therapy: Secondary | ICD-10-CM | POA: Insufficient documentation

## 2018-05-25 DIAGNOSIS — M5441 Lumbago with sciatica, right side: Secondary | ICD-10-CM | POA: Insufficient documentation

## 2018-05-25 MED ORDER — CYCLOBENZAPRINE HCL 10 MG PO TABS: 10.0000 mg | ORAL_TABLET | Freq: Two times a day (BID) | ORAL | 0 refills | Status: AC | PRN

## 2018-05-25 MED ORDER — KETOROLAC TROMETHAMINE 30 MG/ML IJ SOLN
30.0000 mg | Freq: Once | INTRAMUSCULAR | Status: AC
  Administered 2018-05-25: 30 mg via INTRAMUSCULAR
  Filled 2018-05-25: qty 1

## 2018-05-25 MED ORDER — CYCLOBENZAPRINE HCL 10 MG PO TABS
10.0000 mg | ORAL_TABLET | Freq: Once | ORAL | Status: AC
  Administered 2018-05-25: 10 mg via ORAL
  Filled 2018-05-25: qty 1

## 2018-05-25 MED ORDER — NAPROXEN 500 MG PO TABS: 500.0000 mg | ORAL_TABLET | Freq: Two times a day (BID) | ORAL | 0 refills | Status: AC

## 2018-05-25 NOTE — Discharge Instructions (Signed)
Use salonpas patches in the area to help. Return to ED for worsening symptoms, trouble walking, losing control of your bladder, burning with urination, numbness in your legs.
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
## 2018-12-16 ENCOUNTER — Encounter (HOSPITAL_COMMUNITY): Payer: Self-pay

## 2018-12-16 ENCOUNTER — Emergency Department (HOSPITAL_COMMUNITY)
Admission: EM | Admit: 2018-12-16 | Discharge: 2018-12-16 | Disposition: A | Discharge status: Home / Self Care | Payer: Self-pay | Attending: Emergency Medicine | Admitting: Emergency Medicine

## 2018-12-16 ENCOUNTER — Other Ambulatory Visit: Payer: Self-pay

## 2018-12-16 DIAGNOSIS — R1084 Generalized abdominal pain: Secondary | ICD-10-CM | POA: Insufficient documentation

## 2018-12-16 DIAGNOSIS — R112 Nausea with vomiting, unspecified: Secondary | ICD-10-CM | POA: Insufficient documentation

## 2018-12-16 DIAGNOSIS — R197 Diarrhea, unspecified: Secondary | ICD-10-CM

## 2018-12-16 DIAGNOSIS — B349 Viral infection, unspecified: Secondary | ICD-10-CM | POA: Insufficient documentation

## 2018-12-16 DIAGNOSIS — F17210 Nicotine dependence, cigarettes, uncomplicated: Secondary | ICD-10-CM | POA: Insufficient documentation

## 2018-12-16 DIAGNOSIS — Z79899 Other long term (current) drug therapy: Secondary | ICD-10-CM | POA: Insufficient documentation

## 2018-12-16 MED ORDER — ONDANSETRON HCL 4 MG PO TABS: 4.0000 mg | ORAL_TABLET | Freq: Three times a day (TID) | ORAL | 0 refills | Status: AC | PRN

## 2018-12-16 MED ORDER — ALUM & MAG HYDROXIDE-SIMETH 200-200-20 MG/5ML PO SUSP
30.0000 mL | Freq: Once | ORAL | Status: AC
  Administered 2018-12-16: 30 mL via ORAL
  Filled 2018-12-16: qty 30

## 2018-12-16 MED ORDER — LIDOCAINE VISCOUS HCL 2 % MT SOLN
15.0000 mL | Freq: Once | OROMUCOSAL | Status: AC
  Administered 2018-12-16: 15 mL via ORAL
  Filled 2018-12-16: qty 15

## 2018-12-16 MED ORDER — ONDANSETRON 4 MG PO TBDP
4.0000 mg | ORAL_TABLET | Freq: Once | ORAL | Status: AC
  Administered 2018-12-16: 4 mg via ORAL
  Filled 2018-12-16: qty 1

## 2018-12-16 MED ORDER — SODIUM CHLORIDE 0.9% FLUSH: 3.0000 mL | Freq: Once | INTRAVENOUS | Status: DC

## 2018-12-16 NOTE — ED Triage Notes (Signed)
Patient c/o intermittent mid abdominal pain and emesis since yesterday.

## 2018-12-16 NOTE — Discharge Instructions (Signed)
Your history and exam and work-up today are consistent with a viral gastroenteritis causing nausea vomiting, diarrhea, abdominal cramping.  We suspect your chronic reflux was worsened by the spicy stirfry we discussed.  Please avoid spicy foods and use the nausea medicine to help maintain hydration for the next few days.  Please rest and do not go to work today or tomorrow.  If any symptoms change or worsen or you cannot tolerate eating or drinking again, please return to the nearest emergency department.

## 2018-12-16 NOTE — ED Provider Notes (Signed)
[facility]-EMERGENCY DEPT Provider Note   CSN: [id number] Arrival date & time: 12/16/18  7902    History   Chief Complaint Chief Complaint  Patient presents with  . Emesis  . Abdominal Pain    HPI Lawrence Joseph is a 28 y.o. male.     The history is provided by the patient and medical records. No language interpreter was used.  Emesis  Severity:  Moderate Duration:  3 days Timing:  Intermittent Quality:  Stomach contents Progression:  Unchanged Chronicity:  New Recent urination:  Normal Relieved by:  Nothing Ineffective treatments:  None tried Associated symptoms: abdominal pain and diarrhea   Associated symptoms: no chills, no cough, no fever, no headaches, no sore throat and no URI   Risk factors: sick contacts     Past Medical History:  Diagnosis Date  . Colitis   . Metacarpal bone fracture     Patient Active Problem List   Diagnosis Date Noted  . Acute myopericarditis 02/28/2015    History reviewed. No pertinent surgical history.      Home Medications    Prior to Admission medications   Medication Sig Start Date End Date Taking? Authorizing Provider  colchicine 0.6 MG tablet Take 1 tablet (0.6 mg total) by mouth 2 (two) times daily. 03/05/15   Lars Masson, MD  cyclobenzaprine (FLEXERIL) 10 MG tablet Take 1 tablet (10 mg total) by mouth 2 (two) times daily as needed for muscle spasms. 05/25/18   Khatri, Hina, PA-C  HYDROcodone-acetaminophen (NORCO/VICODIN) 5-325 MG per tablet Take 1-2 tablets by mouth every 4 (four) hours as needed for moderate pain or severe pain. Patient not taking: Reported on 02/27/2015 10/31/14   Lurene Shadow, PA-C  ibuprofen (ADVIL,MOTRIN) 600 MG tablet Take 1 tablet (600 mg total) by mouth every 6 (six) hours as needed. Patient not taking: Reported on 02/27/2015 10/31/14   Lurene Shadow, PA-C  naproxen (NAPROSYN) 500 MG tablet Take 1 tablet (500 mg total) by mouth 2 (two) times daily. 05/25/18   Khatri,  Hina, PA-C  oxyCODONE-acetaminophen (PERCOCET/ROXICET) 5-325 MG per tablet Take 1-2 tablets by mouth every 6 (six) hours as needed for severe pain. Patient not taking: Reported on 02/27/2015 01/05/14   Gwyneth Sprout, MD    Family History Family History  Adopted: Yes  Family history unknown: Yes    Social History Social History   Tobacco Use  . Smoking status: Current Every Day Smoker    Packs/day: 0.15    Types: Cigarettes  . Smokeless tobacco: Never Used  Substance Use Topics  . Alcohol use: No  . Drug use: Yes    Types: Marijuana    Comment: daily use     Allergies   Bee venom and Shellfish allergy   Review of Systems Review of Systems  Constitutional: Negative for chills, diaphoresis, fatigue and fever.  HENT: Negative for congestion and sore throat.   Eyes: Negative for visual disturbance.  Respiratory: Negative for cough, chest tightness, shortness of breath, wheezing and stridor.   Cardiovascular: Negative for chest pain, palpitations and leg swelling.  Gastrointestinal: Positive for abdominal pain, diarrhea, nausea and vomiting. Negative for constipation.  Genitourinary: Negative for dysuria, flank pain and frequency.  Musculoskeletal: Negative for back pain, neck pain and neck stiffness.  Skin: Negative for rash and wound.  Neurological: Negative for light-headedness and headaches.  Psychiatric/Behavioral: Negative for agitation.  All other systems reviewed and are negative.    Physical Exam Updated Vital Signs BP  131/79 (BP Location: Right Arm)   Pulse 91   Temp 98.3 F (36.8 C) (Oral)   Resp 16   Ht 5\' 8"  (1.727 m)   Wt 79.4 kg   SpO2 99%   BMI 26.61 kg/m   Physical Exam Vitals signs and nursing note reviewed.  Constitutional:      General: He is not in acute distress.    Appearance: He is well-developed. He is not ill-appearing, toxic-appearing or diaphoretic.  HENT:     Head: Normocephalic and atraumatic.     Mouth/Throat:      Pharynx: No pharyngeal swelling or oropharyngeal exudate.  Eyes:     General: No scleral icterus.    Conjunctiva/sclera: Conjunctivae normal.  Neck:     Musculoskeletal: Neck supple.  Cardiovascular:     Rate and Rhythm: Normal rate and regular rhythm.     Heart sounds: Normal heart sounds. No murmur.  Pulmonary:     Effort: Pulmonary effort is normal. No respiratory distress.     Breath sounds: Normal breath sounds.  Abdominal:     General: Abdomen is flat. Bowel sounds are normal. There is no distension.     Palpations: Abdomen is soft.     Tenderness: There is no abdominal tenderness. There is no right CVA tenderness, left CVA tenderness, guarding or rebound.  Skin:    General: Skin is warm and dry.     Findings: No erythema or rash.  Neurological:     General: No focal deficit present.     Mental Status: He is alert.  Psychiatric:        Mood and Affect: Mood normal.      ED Treatments / Results  Labs (all labs ordered are listed, but only abnormal results are displayed) Labs Reviewed - No data to display  EKG None  Radiology No results found.  Procedures Procedures (including critical care time)  Medications Ordered in ED Medications  alum & mag hydroxide-simeth (MAALOX/MYLANTA) 200-200-20 MG/5ML suspension 30 mL (30 mLs Oral Given 12/16/18 0914)    And  lidocaine (XYLOCAINE) 2 % viscous mouth solution 15 mL (15 mLs Oral Given 12/16/18 0914)  ondansetron (ZOFRAN-ODT) disintegrating tablet 4 mg (4 mg Oral Given 12/16/18 0913)     Initial Impression / Assessment and Plan / ED Course  I have reviewed the triage vital signs and the nursing notes.  Pertinent labs & imaging results that were available during my care of the patient were reviewed by me and considered in my medical decision making (see chart for details).        Lawrence Joseph is a 28 y.o. male with a past medical history significant for reflux who presents with nausea, vomiting, diarrhea, abdominal  cramping, and malaise.  Patient reports that he works at [employer] and is unsure of sick contacts from work but reports that his son has had recent URI and viral symptoms.  He says that his symptoms began 2 days ago with very mild nausea but overnight his progressed to nausea vomiting and loose stools.  He reports he is having some abdominal cramping and burning after he ate spicy stirfry last night and thinks this exacerbated his GERD.  He says he has no nausea medicine at home and is not able to keep fluids down very well.  He was posted work today had [employer] but due to the nausea and vomiting he wants to get checked out.  He denies fevers, chills, chest pain, shortness of breath, palpitations, or  urinary symptoms.  No recent trauma.  Reports this pain is only when he is vomiting.  On exam, abdomen is nontender.  Lungs are clear and chest is nontender.  No murmur.  Patient moving all extremities.  No abnormalities with pulse, strength, or sensation.  Normal lower extremity appearance.  Clinical aspect patient is a viral infection.  He denies significant URI symptoms.  Suspicion for influenza and suspect he has a gastroenteritis.  Shared decision made conversation held with patient we decided not to do labs or fluids or imaging at this time.  Patient said will be given a GI cocktail for his reflux and abdominal cramping as well as a Zofran ODT.  If nausea is improved and patient able to eat and drink, feel he safe for discharge home with a work note.  If symptoms do not improve or he looks more dehydrated, will likely provide fluids and consider labs.  Anticipate reassessment.  10:13 AM Patient is able to eat and drink and was feeling better after medications.  Patient given prescription for nausea medicine and work note.  Patient was able to prove he can maintain hydration and was felt stable for discharge home.  Patient discharged in good condition with improved symptoms.   Final Clinical  Impressions(s) / ED Diagnoses   Final diagnoses:  Nausea vomiting and diarrhea  Generalized abdominal pain  Viral illness    ED Discharge Orders         Ordered    ondansetron (ZOFRAN) 4 MG tablet  Every 8 hours PRN     12/16/18 1015          Clinical Impression: 1. Nausea vomiting and diarrhea   2. Generalized abdominal pain   3. Viral illness     Disposition: Discharge  Condition: Good  I have discussed the results, Dx and Tx plan with the pt(& family if present). He/she/they expressed understanding and agree(s) with the plan. Discharge instructions discussed at great length. Strict return precautions discussed and pt &/or family have verbalized understanding of the instructions. No further questions at time of discharge.    New Prescriptions   ONDANSETRON (ZOFRAN) 4 MG TABLET    Take 1 tablet (4 mg total) by mouth every 8 (eight) hours as needed for nausea or vomiting.    Follow Up: [facility] [address] [place] [address] [phone] Schedule an appointment as soon as possible for a visit    [facility]ENC[facility] DEPT [address] [id number] [place] [address] [phone]         Tegeler, Canary Brim, MD 12/16/18 1016

## 2018-12-16 NOTE — ED Notes (Signed)
Pt able to tolerate PO fluid. Feels better per pt.

## 2018-12-16 NOTE — ED Notes (Signed)
ED Provider at bedside. 

## 2018-12-16 NOTE — ED Notes (Signed)
Pt given ginger ale for PO fluid challenge.  

## 2019-01-11 DEATH — deceased

## 2019-02-04 ENCOUNTER — Emergency Department (HOSPITAL_BASED_OUTPATIENT_CLINIC_OR_DEPARTMENT_OTHER): Payer: Self-pay

## 2019-02-04 ENCOUNTER — Emergency Department (HOSPITAL_BASED_OUTPATIENT_CLINIC_OR_DEPARTMENT_OTHER)
Admission: EM | Admit: 2019-02-04 | Discharge: 2019-02-04 | Disposition: A | Discharge status: Home / Self Care | Payer: Self-pay | Attending: Emergency Medicine | Admitting: Emergency Medicine

## 2019-02-04 ENCOUNTER — Other Ambulatory Visit: Payer: Self-pay

## 2019-02-04 ENCOUNTER — Encounter (HOSPITAL_BASED_OUTPATIENT_CLINIC_OR_DEPARTMENT_OTHER): Payer: Self-pay | Admitting: *Deleted

## 2019-02-04 DIAGNOSIS — Y929 Unspecified place or not applicable: Secondary | ICD-10-CM | POA: Insufficient documentation

## 2019-02-04 DIAGNOSIS — S01511A Laceration without foreign body of lip, initial encounter: Secondary | ICD-10-CM | POA: Insufficient documentation

## 2019-02-04 DIAGNOSIS — Z79899 Other long term (current) drug therapy: Secondary | ICD-10-CM | POA: Insufficient documentation

## 2019-02-04 DIAGNOSIS — Y999 Unspecified external cause status: Secondary | ICD-10-CM | POA: Insufficient documentation

## 2019-02-04 DIAGNOSIS — S42109A Fracture of unspecified part of scapula, unspecified shoulder, initial encounter for closed fracture: Secondary | ICD-10-CM

## 2019-02-04 DIAGNOSIS — Z23 Encounter for immunization: Secondary | ICD-10-CM | POA: Insufficient documentation

## 2019-02-04 DIAGNOSIS — Y939 Activity, unspecified: Secondary | ICD-10-CM | POA: Insufficient documentation

## 2019-02-04 DIAGNOSIS — S4991XA Unspecified injury of right shoulder and upper arm, initial encounter: Secondary | ICD-10-CM | POA: Insufficient documentation

## 2019-02-04 MED ORDER — LIDOCAINE HCL (PF) 1 % IJ SOLN
10.0000 mL | Freq: Once | INTRAMUSCULAR | Status: AC
  Administered 2019-02-04: 10 mL
  Filled 2019-02-04: qty 10

## 2019-02-04 MED ORDER — TETANUS-DIPHTH-ACELL PERTUSSIS 5-2.5-18.5 LF-MCG/0.5 IM SUSP
0.5000 mL | Freq: Once | INTRAMUSCULAR | Status: AC
  Administered 2019-02-04: 0.5 mL via INTRAMUSCULAR
  Filled 2019-02-04: qty 0.5
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
## 2019-02-04 NOTE — ED Notes (Signed)
ED Provider at bedside. 

## 2019-02-04 NOTE — Discharge Instructions (Addendum)
Try to keep your wound as dry as possible.  Do not apply antibiotic ointment, as these are dissolvable sutures.  Please return to the emergency department in 1 week if your sutures are not falling out with a gentle tug.  Please also return if you develop any increasing pain, redness, swelling, yellow drainage coming out of your wound.  Please follow-up with a dentist if you continue to have dental pain or teeth loosening.  Use ice to your lip 3-4 times daily alternating 20 minutes on, 20 minutes off.
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
## 2019-08-16 ENCOUNTER — Emergency Department (HOSPITAL_COMMUNITY)
Admission: EM | Admit: 2019-08-16 | Discharge: 2019-08-16 | Disposition: A | Discharge status: Home / Self Care | Payer: Self-pay | Attending: Emergency Medicine | Admitting: Emergency Medicine

## 2019-08-16 ENCOUNTER — Encounter (HOSPITAL_COMMUNITY): Payer: Self-pay

## 2019-08-16 ENCOUNTER — Other Ambulatory Visit: Payer: Self-pay

## 2019-08-16 DIAGNOSIS — K0889 Other specified disorders of teeth and supporting structures: Secondary | ICD-10-CM

## 2019-08-16 DIAGNOSIS — F17210 Nicotine dependence, cigarettes, uncomplicated: Secondary | ICD-10-CM | POA: Insufficient documentation

## 2019-08-16 DIAGNOSIS — K029 Dental caries, unspecified: Secondary | ICD-10-CM | POA: Insufficient documentation

## 2019-08-16 MED ORDER — OXYCODONE-ACETAMINOPHEN 5-325 MG PO TABS
1.0000 | ORAL_TABLET | Freq: Once | ORAL | Status: AC
  Administered 2019-08-16: 1 via ORAL
  Filled 2019-08-16: qty 1

## 2019-08-16 NOTE — ED Triage Notes (Signed)
Pt complains of dental pain and has finished a round of antibiotics, he continues to complain of pain on the upper and lower left side

## 2019-08-16 NOTE — Discharge Instructions (Signed)
I have provided several dental resources, please attempt to schedule an appointment with any of them for further management of your dental pain.  Please alternate Tylenol or ibuprofen to help with your symptoms.

## 2019-08-16 NOTE — ED Provider Notes (Signed)
[facility]-EMERGENCY DEPT Provider Note   CSN: [id number] Arrival date & time: 08/16/19  0516     History   Chief Complaint No chief complaint on file.   HPI Lawrence Joseph is a 28 y.o. male.     [age] y.o male with no PMH presents to the ED with a chief complaint of dental pain x several weeks.  Patient was seen by [facility], had a prescription for amoxicillin which she has completed.  He reports he is scheduled to have his teeth removed on the left side of his mouth.  Reports the pain has become excruciating, he has been taking ibuprofen 800 mg around-the-clock without any improvement in symptoms.  Patient reports the pain was severe as he could not work today and called off work.  He does not have an appointment for the removal of his teeth, he states he is unable to come up with the money to have this appointment scheduled.  Patient also requesting amoxicillin prescription.  He denies any difficulty swallowing, fever, facial swelling, pain with eye movement.  The history is provided by the patient.    Past Medical History:  Diagnosis Date  . Colitis   . Metacarpal bone fracture     Patient Active Problem List   Diagnosis Date Noted  . Acute myopericarditis 02/28/2015    Past Surgical History:  Procedure Laterality Date  . NASAL FRACTURE SURGERY          Home Medications    Prior to Admission medications   Medication Sig Start Date End Date Taking? Authorizing Provider  colchicine 0.6 MG tablet Take 1 tablet (0.6 mg total) by mouth 2 (two) times daily. Patient not taking: Reported on 12/16/2018 03/05/15   Lars MassonNelson, Katarina H, MD  cyclobenzaprine (FLEXERIL) 10 MG tablet Take 1 tablet (10 mg total) by mouth 2 (two) times daily as needed for muscle spasms. Patient not taking: Reported on 12/16/2018 05/25/18   Dietrich PatesKhatri, Hina, PA-C  HYDROcodone-acetaminophen (NORCO/VICODIN) 5-325 MG per tablet Take 1-2 tablets by mouth every 4 (four) hours as needed for  moderate pain or severe pain. Patient not taking: Reported on 02/27/2015 10/31/14   Lurene ShadowPhelps, Erin O, PA-C  ibuprofen (ADVIL,MOTRIN) 600 MG tablet Take 1 tablet (600 mg total) by mouth every 6 (six) hours as needed. Patient not taking: Reported on 02/27/2015 10/31/14   Lurene ShadowPhelps, Erin O, PA-C  naproxen (NAPROSYN) 500 MG tablet Take 1 tablet (500 mg total) by mouth 2 (two) times daily. Patient not taking: Reported on 12/16/2018 05/25/18   Dietrich PatesKhatri, Hina, PA-C  ondansetron (ZOFRAN) 4 MG tablet Take 1 tablet (4 mg total) by mouth every 8 (eight) hours as needed for nausea or vomiting. 12/16/18   Tegeler, Canary Brimhristopher J, MD  oxyCODONE-acetaminophen (PERCOCET/ROXICET) 5-325 MG per tablet Take 1-2 tablets by mouth every 6 (six) hours as needed for severe pain. Patient not taking: Reported on 02/27/2015 01/05/14   Gwyneth SproutPlunkett, Whitney, MD    Family History Family History  Adopted: Yes  Family history unknown: Yes    Social History Social History   Tobacco Use  . Smoking status: Current Every Day Smoker    Packs/day: 0.15    Types: Cigarettes  . Smokeless tobacco: Never Used  Substance Use Topics  . Alcohol use: No  . Drug use: Yes    Types: Marijuana    Comment: daily use     Allergies   Bee venom and Shellfish allergy   Review of Systems Review of Systems  Constitutional: Negative for fever.  HENT: Positive for dental problem. Negative for facial swelling.      Physical Exam Updated Vital Signs BP (!) 141/99 (BP Location: Left Arm)   Pulse 69   Temp 98.1 F (36.7 C) (Oral)   Resp 16   Ht 5\' 8"  (1.727 m)   Wt 74.8 kg   SpO2 98%   BMI 25.09 kg/m   Physical Exam Vitals signs and nursing note reviewed.  Constitutional:      Appearance: Normal appearance. He is well-developed.     Comments: Appears uncomfortable   HENT:     Head: Normocephalic and atraumatic.     Mouth/Throat:     Lips: Pink.     Mouth: Mucous membranes are moist.     Dentition: Abnormal dentition. Dental tenderness  present. No dental abscesses.     Pharynx: Uvula midline. No pharyngeal swelling, oropharyngeal exudate, posterior oropharyngeal erythema or uvula swelling.      Comments: Multiple caries is noted throughout his whole mouth, missing tooth to the upper and lower left gum region.  Eyes:     General: No scleral icterus.    Pupils: Pupils are equal, round, and reactive to light.  Neck:     Musculoskeletal: Normal range of motion and neck supple.  Cardiovascular:     Heart sounds: Normal heart sounds.  Pulmonary:     Effort: Pulmonary effort is normal.  Abdominal:     General: Bowel sounds are normal.     Palpations: Abdomen is soft.  Musculoskeletal:        General: No tenderness or deformity.  Skin:    General: Skin is warm and dry.  Neurological:     Mental Status: He is alert and oriented to person, place, and time.      ED Treatments / Results  Labs (all labs ordered are listed, but only abnormal results are displayed) Labs Reviewed - No data to display  EKG None  Radiology No results found.  Procedures Procedures (including critical care time)  Medications Ordered in ED Medications  oxyCODONE-acetaminophen (PERCOCET/ROXICET) 5-325 MG per tablet 1 tablet (has no administration in time range)     Initial Impression / Assessment and Plan / ED Course  I have reviewed the triage vital signs and the nursing notes.  Pertinent labs & imaging results that were available during my care of the patient were reviewed by me and considered in my medical decision making (see chart for details).       Patient with no pertinent past medical history presents to the ED with complaints of dental pain, reports has been going on for several weeks.  He was seen by [facility], had prescription for amoxicillin given, reports he completed this prescription.  He is been taking ibuprofen 800 mg without any improvement in symptoms.  He reports he is unable to work, talk, eat due to the  pain.  During evaluation there is multiple keratoses, there is poor dentition throughout his whole mouth.  There is no facial swelling, abscess, changes in the skin that suggest any cellulitis.  Patient was provided with Percocet while in the ED to help with his pain.  He was also provided with dental resources in order to schedule an appointment for further management of his dental pain.  Patient with otherwise stable vital signs stable for discharge.   Portions of this note were generated with [employer]. Dictation errors may occur despite best attempts at proofreading.  Final Clinical  Impressions(s) / ED Diagnoses   Final diagnoses:  Pain, dental    ED Discharge Orders    None       Claude Manges, PA-C 08/16/19 0546    Nira Conn, MD 08/16/19 [id number]
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
